# Patient Record
Sex: Male | Born: 1970 | Race: White | Hispanic: No | Marital: Married | Smoking: Former smoker
Health system: Southern US, Community
[De-identification: ages and names within clinical notes are randomized; demographics above are authoritative.]

## PROBLEM LIST (undated history)

## (undated) DIAGNOSIS — J302 Other seasonal allergic rhinitis: Secondary | ICD-10-CM

## (undated) DIAGNOSIS — Z9109 Other allergy status, other than to drugs and biological substances: Secondary | ICD-10-CM

## (undated) DIAGNOSIS — T7840XA Allergy, unspecified, initial encounter: Secondary | ICD-10-CM

## (undated) DIAGNOSIS — C801 Malignant (primary) neoplasm, unspecified: Secondary | ICD-10-CM

## (undated) HISTORY — DX: Allergy, unspecified, initial encounter: T78.40XA

## (undated) HISTORY — DX: Malignant (primary) neoplasm, unspecified: C80.1

## (undated) HISTORY — DX: Other allergy status, other than to drugs and biological substances: Z91.09

## (undated) HISTORY — PX: WISDOM TOOTH EXTRACTION: SHX21

## (undated) HISTORY — PX: BASAL CELL CARCINOMA EXCISION: SHX1214

## (undated) HISTORY — DX: Other seasonal allergic rhinitis: J30.2

## (undated) HISTORY — PX: DENTAL SURGERY: SHX609

## (undated) HISTORY — PX: KNEE ARTHROSCOPY: SHX127

---

## 1991-04-29 HISTORY — PX: APPENDECTOMY: SHX54

## 2015-11-27 ENCOUNTER — Telehealth: Payer: Self-pay | Admitting: *Deleted

## 2015-11-27 NOTE — Telephone Encounter (Signed)
Unable to reach patient at time of Pre-Visit Call.  Left message for patient to return call when available.    

## 2015-11-28 ENCOUNTER — Ambulatory Visit (INDEPENDENT_AMBULATORY_CARE_PROVIDER_SITE_OTHER): Payer: Managed Care, Other (non HMO) | Admitting: Physician Assistant

## 2015-11-28 ENCOUNTER — Encounter: Payer: Self-pay | Admitting: Physician Assistant

## 2015-11-28 VITALS — BP 128/88 | Temp 98.3°F | Resp 16 | Ht 70.0 in | Wt 299.5 lb

## 2015-11-28 DIAGNOSIS — Z23 Encounter for immunization: Secondary | ICD-10-CM

## 2015-11-28 DIAGNOSIS — E668 Other obesity: Secondary | ICD-10-CM

## 2015-11-28 DIAGNOSIS — R7989 Other specified abnormal findings of blood chemistry: Secondary | ICD-10-CM | POA: Diagnosis not present

## 2015-11-28 DIAGNOSIS — Z0000 Encounter for general adult medical examination without abnormal findings: Secondary | ICD-10-CM | POA: Diagnosis not present

## 2015-11-28 DIAGNOSIS — IMO0002 Reserved for concepts with insufficient information to code with codable children: Secondary | ICD-10-CM

## 2015-11-28 LAB — COMPREHENSIVE METABOLIC PANEL
ALT: 30 U/L (ref 0–53)
AST: 19 U/L (ref 0–37)
Albumin: 4.3 g/dL (ref 3.5–5.2)
Alkaline Phosphatase: 69 U/L (ref 39–117)
BUN: 12 mg/dL (ref 6–23)
CALCIUM: 9.3 mg/dL (ref 8.4–10.5)
CHLORIDE: 103 meq/L (ref 96–112)
CO2: 25 meq/L (ref 19–32)
Creatinine, Ser: 0.94 mg/dL (ref 0.40–1.50)
GFR: 92.28 mL/min (ref >60.00)
Glucose, Bld: 96 mg/dL (ref 70–99)
POTASSIUM: 3.9 meq/L (ref 3.5–5.1)
Sodium: 137 mEq/L (ref 135–145)
Total Bilirubin: 0.6 mg/dL (ref 0.2–1.2)
Total Protein: 7.5 g/dL (ref 6.0–8.3)

## 2015-11-28 LAB — URINALYSIS, ROUTINE W REFLEX MICROSCOPIC
Bilirubin Urine: NEGATIVE
KETONES UR: NEGATIVE
Leukocytes, UA: NEGATIVE
Nitrite: NEGATIVE
SPECIFIC GRAVITY, URINE: 1.02 (ref 1.000–1.030)
Total Protein, Urine: NEGATIVE
UROBILINOGEN UA: 0.2 (ref 0.0–1.0)
Urine Glucose: NEGATIVE
WBC UA: NONE SEEN (ref 0–?)
pH: 6 (ref 5.0–8.0)

## 2015-11-28 LAB — CBC
HCT: 43.4 % (ref 39.0–52.0)
HEMOGLOBIN: 15.1 g/dL (ref 13.0–17.0)
MCHC: 34.7 g/dL (ref 30.0–36.0)
MCV: 90 fl (ref 78.0–100.0)
PLATELETS: 236 10*3/uL (ref 150.0–400.0)
RBC: 4.82 Mil/uL (ref 4.22–5.81)
RDW: 13.1 % (ref 11.5–15.5)
WBC: 6.4 10*3/uL (ref 4.0–10.5)

## 2015-11-28 LAB — LIPID PANEL
CHOL/HDL RATIO: 5
Cholesterol: 187 mg/dL (ref 0–200)
HDL: 34.3 mg/dL — AB (ref >39.00)
NONHDL: 153.18
TRIGLYCERIDES: 219 mg/dL — AB (ref 0.0–149.0)
VLDL: 43.8 mg/dL — AB (ref 0.0–40.0)

## 2015-11-28 LAB — LDL CHOLESTEROL, DIRECT: LDL DIRECT: 125 mg/dL

## 2015-11-28 LAB — HEMOGLOBIN A1C: Hgb A1c MFr Bld: 5.6 % (ref 4.6–6.5)

## 2015-11-28 LAB — TSH: TSH: 1.24 u[IU]/mL (ref 0.35–4.50)

## 2015-11-28 NOTE — Progress Notes (Signed)
Patient presents to clinic today to establish care. Patient has not seen a healthcare provider in some time. Was encouraged by wife who is a patient here, to establish care. Body mass index is 42.97 kg/m. Endorses well-balance diet. 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 Notes only exercise is chasing after his children, ages 45 and 45.  Acute Concerns: Denies acute concerns today.  Chronic Issues: Denies known chronic medial issues.  Health Maintenance: Immunizations -- Due for TDaP. Agrees to have today.  Past Medical History:  Diagnosis Date  . Environmental allergies   . Seasonal allergies     Past Surgical History:  Procedure Laterality Date  . APPENDECTOMY  1993  . DENTAL SURGERY    . WISDOM TOOTH EXTRACTION     Approx 15 yrs ago    No current outpatient prescriptions on file prior to visit.   No current facility-administered medications on file prior to visit.     Allergies  Allergen Reactions  . Amoxicillin Other (See Comments)    Nosebleeds    Family History  Problem Relation Age of Onset  . Ovarian cancer Mother 86    Deceased  . Diabetes Mother   . Hypertension Mother   . Melanoma Father     Living  . Hypertension Father   . Diabetes Father   . Arthritis/Rheumatoid Sister     #1  . Healthy Sister     #2  . Healthy Brother     x1  . Diabetes Paternal Grandmother   . Emphysema Paternal Grandfather   . Healthy Daughter     Age 47  . Healthy Son     Age 52    Social History   Social History  . Marital status: Married    Spouse name: N/A  . Number of children: N/A  . Years of education: N/A   Occupational History  . Not on file.   Social History Main Topics  . Smoking status: Light Tobacco Smoker    Types: Cigars  . Smokeless tobacco: Never Used     Comment: Cigar Only, not regularly  . Alcohol use 2.4 oz/week    4 Cans of beer per week  . Drug use: No  . Sexual activity: Yes    Partners: Female     Comment: wife   Other  Topics Concern  . Not on file   Social History Narrative   Born in [place]. Moved to the area 21 years.    Is married -- Sammuel Hines And Claris Gower -- 11 and 9 respectively.          Review of Systems  Constitutional: Negative for fever and weight loss.  HENT: Negative for ear discharge, ear pain, hearing loss and tinnitus.   Eyes: Negative for blurred vision, double vision, photophobia and pain.  Respiratory: Negative for cough and shortness of breath.   Cardiovascular: Negative for chest pain and palpitations.  Gastrointestinal: Negative for abdominal pain, blood in stool, constipation, diarrhea, heartburn, melena, nausea and vomiting.  Genitourinary: Negative for dysuria, flank pain, frequency, hematuria and urgency.  Musculoskeletal: Negative for falls.  Neurological: Negative for dizziness, loss of consciousness and headaches.  Endo/Heme/Allergies: Negative for environmental allergies.  Psychiatric/Behavioral: Negative for depression, hallucinations, substance abuse and suicidal ideas. The patient is not nervous/anxious and does not have insomnia.     BP 128/88 (BP Location: Right Arm, Patient Position: Sitting, Cuff Size: Large)   Temp 98.3 F (36.8 C) (Oral)  Resp 16   Ht  (1.778 m)   Wt 299 lb 8 oz (135.9 kg)   SpO2 98%   BMI 42.97 kg/m   Physical Exam  Constitutional: He is oriented to person, place, and time and well-developed, well-nourished, and in no distress.  HENT:  Head: Normocephalic and atraumatic.  Right Ear: External ear normal.  Left Ear: External ear normal.  Nose: Nose normal.  Mouth/Throat: Oropharynx is clear and moist. No oropharyngeal exudate.  Eyes: Conjunctivae and EOM are normal. Pupils are equal, round, and reactive to light.  Neck: Neck supple. No thyromegaly present.  Cardiovascular: Normal rate, regular rhythm, normal heart sounds and intact distal pulses.   Pulmonary/Chest: Effort normal and breath sounds normal. No  respiratory distress. He has no wheezes. He has no rales. He exhibits no tenderness.  Abdominal: Soft. Bowel sounds are normal. He exhibits no distension and no mass. There is no tenderness. There is no rebound and no guarding.  Genitourinary: Testes/scrotum normal.  Lymphadenopathy:    He has no cervical adenopathy.  Neurological: He is alert and oriented to person, place, and time.  Skin: Skin is warm and dry. No rash noted.  Psychiatric: Affect normal.  Vitals reviewed.  Assessment/Plan: Adult BMI > 30 Body mass index is 42.97 kg/m. Discussed exercise goal of 150 minutes per week. Calorie counting reviewed. Will check TSH today. Will follow.  Visit for preventive health examination Depression screen negative. Health Maintenance reviewed -- Declines HIV screen. TDaP updated today. Preventive schedule discussed and handout given in AVS. Will obtain fasting labs today.     Piedad Climes, PA-C

## 2015-11-28 NOTE — Progress Notes (Signed)
Pre visit review using our clinic review tool, if applicable. No additional management support is needed unless otherwise documented below in the visit note/SLS  

## 2015-11-28 NOTE — Patient Instructions (Signed)
Please go to the lab for blood work.   Our office will call you with your results unless you have chosen to receive results via MyChart.  If your blood work is normal we will follow-up each year for physicals and as scheduled for chronic medical problems.  If anything is abnormal we will treat accordingly and get you in for a follow-up.  I recommend you work on exercise to promote healthy weight. Goal is for 150 minutes of exercise per week. I would start with 30 minutes per day for 2-3 days for a couple of weeks before increasing further.   Preventive Care for Adults, Male A healthy lifestyle and preventive care can promote health and wellness. Preventive health guidelines for men include the following key practices:  A routine yearly physical is a good way to check with your health care provider about your health and preventative screening. It is a chance to share any concerns and updates on your health and to receive a thorough exam.  Visit your dentist for a routine exam and preventative care every 6 months. Brush your teeth twice a day and floss once a day. Good oral hygiene prevents tooth decay and gum disease.  The frequency of eye exams is based on your age, health, family medical history, use of contact lenses, and other factors. Follow your health care provider's recommendations for frequency of eye exams.  Eat a healthy diet. Foods such as vegetables, fruits, whole grains, low-fat dairy products, and lean protein foods contain the nutrients you need without too many calories. Decrease your intake of foods high in solid fats, added sugars, and salt. Eat the right amount of calories for you.Get information about a proper diet from your health care provider, if necessary.  Regular physical exercise is one of the most important things you can do for your health. Most adults should get at least 150 minutes of moderate-intensity exercise (any activity that increases your heart rate and  causes you to sweat) each week. In addition, most adults need muscle-strengthening exercises on 2 or more days a week.  Maintain a healthy weight. The body mass index (BMI) is a screening tool to identify possible weight problems. It provides an estimate of body fat based on height and weight. Your health care provider can find your BMI and can help you achieve or maintain a healthy weight.For adults 20 years and older:  A BMI below 18.5 is considered underweight.  A BMI of 18.5 to 24.9 is normal.  A BMI of 25 to 29.9 is considered overweight.  A BMI of 30 and above is considered obese.  Maintain normal blood lipids and cholesterol levels by exercising and minimizing your intake of saturated fat. Eat a balanced diet with plenty of fruit and vegetables. Blood tests for lipids and cholesterol should begin at age 70 and be repeated every 5 years. If your lipid or cholesterol levels are high, you are over 50, or you are at high risk for heart disease, you may need your cholesterol levels checked more frequently.Ongoing high lipid and cholesterol levels should be treated with medicines if diet and exercise are not working.  If you smoke, find out from your health care provider how to quit. If you do not use tobacco, do not start.  Lung cancer screening is recommended for adults aged 49-80 years who are at high risk for developing lung cancer because of a history of smoking. A yearly low-dose CT scan of the lungs is recommended for people  who have at least a 30-pack-year history of smoking and are a current smoker or have quit within the past 15 years. A pack year of smoking is smoking an average of 1 pack of cigarettes a day for 1 year (for example: 1 pack a day for 30 years or 2 packs a day for 15 years). Yearly screening should continue until the smoker has stopped smoking for at least 15 years. Yearly screening should be stopped for people who develop a health problem that would prevent them from  having lung cancer treatment.  If you choose to drink alcohol, do not have more than 2 drinks per day. One drink is considered to be 12 ounces (355 mL) of beer, 5 ounces (148 mL) of wine, or 1.5 ounces (44 mL) of liquor.  Avoid use of street drugs. Do not share needles with anyone. Ask for help if you need support or instructions about stopping the use of drugs.  High blood pressure causes heart disease and increases the risk of stroke. Your blood pressure should be checked at least every 1-2 years. Ongoing high blood pressure should be treated with medicines, if weight loss and exercise are not effective.  If you are 56-55 years old, ask your health care provider if you should take aspirin to prevent heart disease.  Diabetes screening is done by taking a blood sample to check your blood glucose level after you have not eaten for a certain period of time (fasting). If you are not overweight and you do not have risk factors for diabetes, you should be screened once every 3 years starting at age 71. If you are overweight or obese and you are 34-57 years of age, you should be screened for diabetes every year as part of your cardiovascular risk assessment.  Colorectal cancer can be detected and often prevented. Most routine colorectal cancer screening begins at the age of 67 and continues through age 77. However, your health care provider may recommend screening at an earlier age if you have risk factors for colon cancer. On a yearly basis, your health care provider may provide home test kits to check for hidden blood in the stool. Use of a small camera at the end of a tube to directly examine the colon (sigmoidoscopy or colonoscopy) can detect the earliest forms of colorectal cancer. Talk to your health care provider about this at age 15, when routine screening begins. Direct exam of the colon should be repeated every 5-10 years through age 56, unless early forms of precancerous polyps or small growths are  found.  People who are at an increased risk for hepatitis B should be screened for this virus. You are considered at high risk for hepatitis B if:  You were born in a country where hepatitis B occurs often. Talk with your health care provider about which countries are considered high risk.  Your parents were born in a high-risk country and you have not received a shot to protect against hepatitis B (hepatitis B vaccine).  You have HIV or AIDS.  You use needles to inject street drugs.  You live with, or have sex with, someone who has hepatitis B.  You are a man who has sex with other men (MSM).  You get hemodialysis treatment.  You take certain medicines for conditions such as cancer, organ transplantation, and autoimmune conditions.  Hepatitis C blood testing is recommended for all people born from [birth year] through 1965 and any individual with known risks for hepatitis C.  Practice safe sex. Use condoms and avoid high-risk sexual practices to reduce the spread of sexually transmitted infections (STIs). STIs include gonorrhea, chlamydia, syphilis, trichomonas, herpes, HPV, and human immunodeficiency virus (HIV). Herpes, HIV, and HPV are viral illnesses that have no cure. They can result in disability, cancer, and death.  If you are a man who has sex with other men, you should be screened at least once per year for:  HIV.  Urethral, rectal, and pharyngeal infection of gonorrhea, chlamydia, or both.  If you are at risk of being infected with HIV, it is recommended that you take a prescription medicine daily to prevent HIV infection. This is called preexposure prophylaxis (PrEP). You are considered at risk if:  You are a man who has sex with other men (MSM) and have other risk factors.  You are a heterosexual man, are sexually active, and are at increased risk for HIV infection.  You take drugs by injection.  You are sexually active with a partner who has HIV.  Talk with your health  care provider about whether you are at high risk of being infected with HIV. If you choose to begin PrEP, you should first be tested for HIV. You should then be tested every 3 months for as long as you are taking PrEP.  A one-time screening for abdominal aortic aneurysm (AAA) and surgical repair of large AAAs by ultrasound are recommended for men ages 51 to 14 years who are current or former smokers.  Healthy men should no longer receive prostate-specific antigen (PSA) blood tests as part of routine cancer screening. Talk with your health care provider about prostate cancer screening.  Testicular cancer screening is not recommended for adult males who have no symptoms. Screening includes self-exam, a health care provider exam, and other screening tests. Consult with your health care provider about any symptoms you have or any concerns you have about testicular cancer.  Use sunscreen. Apply sunscreen liberally and repeatedly throughout the day. You should seek shade when your shadow is shorter than you. Protect yourself by wearing long sleeves, pants, a wide-brimmed hat, and sunglasses year round, whenever you are outdoors.  Once a month, do a whole-body skin exam, using a mirror to look at the skin on your back. Tell your health care provider about new moles, moles that have irregular borders, moles that are larger than a pencil eraser, or moles that have changed in shape or color.  Stay current with required vaccines (immunizations).  Influenza vaccine. All adults should be immunized every year.  Tetanus, diphtheria, and acellular pertussis (Td, Tdap) vaccine. An adult who has not previously received Tdap or who does not know his vaccine status should receive 1 dose of Tdap. This initial dose should be followed by tetanus and diphtheria toxoids (Td) booster doses every 10 years. Adults with an unknown or incomplete history of completing a 3-dose immunization series with Td-containing vaccines should  begin or complete a primary immunization series including a Tdap dose. Adults should receive a Td booster every 10 years.  Varicella vaccine. An adult without evidence of immunity to varicella should receive 2 doses or a second dose if he has previously received 1 dose.  Human papillomavirus (HPV) vaccine. Males aged 11-21 years who have not received the vaccine previously should receive the 3-dose series. Males aged 22-26 years may be immunized. Immunization is recommended through the age of 39 years for any male who has sex with males and did not get any or all doses  earlier. Immunization is recommended for any person with an immunocompromised condition through the age of 39 years if he did not get any or all doses earlier. During the 3-dose series, the second dose should be obtained 4-8 weeks after the first dose. The third dose should be obtained 24 weeks after the first dose and 16 weeks after the second dose.  Zoster vaccine. One dose is recommended for adults aged 44 years or older unless certain conditions are present.  Measles, mumps, and rubella (MMR) vaccine. Adults born before [birth year] generally are considered immune to measles and mumps. Adults born in [birth year] or later should have 1 or more doses of MMR vaccine unless there is a contraindication to the vaccine or there is laboratory evidence of immunity to each of the three diseases. A routine second dose of MMR vaccine should be obtained at least 28 days after the first dose for students attending postsecondary schools, health care workers, or international travelers. People who received inactivated measles vaccine or an unknown type of measles vaccine during 1963-1967 should receive 2 doses of MMR vaccine. People who received inactivated mumps vaccine or an unknown type of mumps vaccine before 1979 and are at high risk for mumps infection should consider immunization with 2 doses of MMR vaccine. Unvaccinated health care workers born before [birth year] who  lack laboratory evidence of measles, mumps, or rubella immunity or laboratory confirmation of disease should consider measles and mumps immunization with 2 doses of MMR vaccine or rubella immunization with 1 dose of MMR vaccine.  Pneumococcal 13-valent conjugate (PCV13) vaccine. When indicated, a person who is uncertain of his immunization history and has no record of immunization should receive the PCV13 vaccine. All adults 54 years of age and older should receive this vaccine. An adult aged 42 years or older who has certain medical conditions and has not been previously immunized should receive 1 dose of PCV13 vaccine. This PCV13 should be followed with a dose of pneumococcal polysaccharide (PPSV23) vaccine. Adults who are at high risk for pneumococcal disease should obtain the PPSV23 vaccine at least 8 weeks after the dose of PCV13 vaccine. Adults older than 45 years of age who have normal immune system function should obtain the PPSV23 vaccine dose at least 1 year after the dose of PCV13 vaccine.  Pneumococcal polysaccharide (PPSV23) vaccine. When PCV13 is also indicated, PCV13 should be obtained first. All adults aged 10 years and older should be immunized. An adult younger than age 81 years who has certain medical conditions should be immunized. Any person who resides in a nursing home or long-term care facility should be immunized. An adult smoker should be immunized. People with an immunocompromised condition and certain other conditions should receive both PCV13 and PPSV23 vaccines. People with human immunodeficiency virus (HIV) infection should be immunized as soon as possible after diagnosis. Immunization during chemotherapy or radiation therapy should be avoided. Routine use of PPSV23 vaccine is not recommended for American Indians, [employer] Natives, or people younger than 65 years unless there are medical conditions that require PPSV23 vaccine. When indicated, people who have unknown immunization and  have no record of immunization should receive PPSV23 vaccine. One-time revaccination 5 years after the first dose of PPSV23 is recommended for people aged 19-64 years who have chronic kidney failure, nephrotic syndrome, asplenia, or immunocompromised conditions. People who received 1-2 doses of PPSV23 before age 71 years should receive another dose of PPSV23 vaccine at age 37 years or later if at least 5 years have  passed since the previous dose. Doses of PPSV23 are not needed for people immunized with PPSV23 at or after age 56 years.  Meningococcal vaccine. Adults with asplenia or persistent complement component deficiencies should receive 2 doses of quadrivalent meningococcal conjugate (MenACWY-D) vaccine. The doses should be obtained at least 2 months apart. Microbiologists working with certain meningococcal bacteria, [employer] recruits, people at risk during an outbreak, and people who travel to or live in countries with a high rate of meningitis should be immunized. A first-year college student up through age 49 years who is living in a residence hall should receive a dose if he did not receive a dose on or after his 16th birthday. Adults who have certain high-risk conditions should receive one or more doses of vaccine.  Hepatitis A vaccine. Adults who wish to be protected from this disease, have chronic liver disease, work with hepatitis A-infected animals, work in hepatitis A research labs, or travel to or work in countries with a high rate of hepatitis A should be immunized. Adults who were previously unvaccinated and who anticipate close contact with an international adoptee during the first 60 days after arrival in the [place] from a country with a high rate of hepatitis A should be immunized.  Hepatitis B vaccine. Adults should be immunized if they wish to be protected from this disease, are under age 3 years and have diabetes, have chronic liver disease, have had more than one sex partner in  the past 6 months, may be exposed to blood or other infectious body fluids, are household contacts or sex partners of hepatitis B positive people, are clients or workers in certain care facilities, or travel to or work in countries with a high rate of hepatitis B.  Haemophilus influenzae type b (Hib) vaccine. A previously unvaccinated person with asplenia or sickle cell disease or having a scheduled splenectomy should receive 1 dose of Hib vaccine. Regardless of previous immunization, a recipient of a hematopoietic stem cell transplant should receive a 3-dose series 6-12 months after his successful transplant. Hib vaccine is not recommended for adults with HIV infection. Preventive Service / Frequency Ages 62 to 45  Blood pressure check.** / Every 3-5 years.  Lipid and cholesterol check.** / Every 5 years beginning at age 89.  Hepatitis C blood test.** / For any individual with known risks for hepatitis C.  Skin self-exam. / Monthly.  Influenza vaccine. / Every year.  Tetanus, diphtheria, and acellular pertussis (Tdap, Td) vaccine.** / Consult your health care provider. 1 dose of Td every 10 years.  Varicella vaccine.** / Consult your health care provider.  HPV vaccine. / 3 doses over 6 months, if 67 or younger.  Measles, mumps, rubella (MMR) vaccine.** / You need at least 1 dose of MMR if you were born in 1957 or later. You may also need a second dose.  Pneumococcal 13-valent conjugate (PCV13) vaccine.** / Consult your health care provider.  Pneumococcal polysaccharide (PPSV23) vaccine.** / 1 to 2 doses if you smoke cigarettes or if you have certain conditions.  Meningococcal vaccine.** / 1 dose if you are age 38 to 67 years and a [employer] researcher living in a residence hall, or have one of several medical conditions. You may also need additional booster doses.  Hepatitis A vaccine.** / Consult your health care provider.  Hepatitis B vaccine.** / Consult your health care  provider.  Haemophilus influenzae type b (Hib) vaccine.** / Consult your health care provider. Ages 58 to 57  Blood pressure check.** / Every year.  Lipid and cholesterol check.** / Every 5 years beginning at age 42.  Lung cancer screening. / Every year if you are aged 48-80 years and have a 30-pack-year history of smoking and currently smoke or have quit within the past 15 years. Yearly screening is stopped once you have quit smoking for at least 15 years or develop a health problem that would prevent you from having lung cancer treatment.  Fecal occult blood test (FOBT) of stool. / Every year beginning at age 20 and continuing until age 96. You may not have to do this test if you get a colonoscopy every 10 years.  Flexible sigmoidoscopy** or colonoscopy.** / Every 5 years for a flexible sigmoidoscopy or every 10 years for a colonoscopy beginning at age 57 and continuing until age 18.  Hepatitis C blood test.** / For all people born from [birth year] through 1965 and any individual with known risks for hepatitis C.  Skin self-exam. / Monthly.  Influenza vaccine. / Every year.  Tetanus, diphtheria, and acellular pertussis (Tdap/Td) vaccine.** / Consult your health care provider. 1 dose of Td every 10 years.  Varicella vaccine.** / Consult your health care provider.  Zoster vaccine.** / 1 dose for adults aged 31 years or older.  Measles, mumps, rubella (MMR) vaccine.** / You need at least 1 dose of MMR if you were born in 1957 or later. You may also need a second dose.  Pneumococcal 13-valent conjugate (PCV13) vaccine.** / Consult your health care provider.  Pneumococcal polysaccharide (PPSV23) vaccine.** / 1 to 2 doses if you smoke cigarettes or if you have certain conditions.  Meningococcal vaccine.** / Consult your health care provider.  Hepatitis A vaccine.** / Consult your health care provider.  Hepatitis B vaccine.** / Consult your health care provider.  Haemophilus influenzae  type b (Hib) vaccine.** / Consult your health care provider. Ages 28 and over  Blood pressure check.** / Every year.  Lipid and cholesterol check.**/ Every 5 years beginning at age 64.  Lung cancer screening. / Every year if you are aged 74-80 years and have a 30-pack-year history of smoking and currently smoke or have quit within the past 15 years. Yearly screening is stopped once you have quit smoking for at least 15 years or develop a health problem that would prevent you from having lung cancer treatment.  Fecal occult blood test (FOBT) of stool. / Every year beginning at age 51 and continuing until age 48. You may not have to do this test if you get a colonoscopy every 10 years.  Flexible sigmoidoscopy** or colonoscopy.** / Every 5 years for a flexible sigmoidoscopy or every 10 years for a colonoscopy beginning at age 55 and continuing until age 42.  Hepatitis C blood test.** / For all people born from [birth year] through 1965 and any individual with known risks for hepatitis C.  Abdominal aortic aneurysm (AAA) screening.** / A one-time screening for ages 61 to 69 years who are current or former smokers.  Skin self-exam. / Monthly.  Influenza vaccine. / Every year.  Tetanus, diphtheria, and acellular pertussis (Tdap/Td) vaccine.** / 1 dose of Td every 10 years.  Varicella vaccine.** / Consult your health care provider.  Zoster vaccine.** / 1 dose for adults aged 72 years or older.  Pneumococcal 13-valent conjugate (PCV13) vaccine.** / 1 dose for all adults aged 7 years and older.  Pneumococcal polysaccharide (PPSV23) vaccine.** / 1 dose for all adults aged 28 years and older.  Meningococcal vaccine.** / Consult your health care provider.  Hepatitis A vaccine.** / Consult your health care provider.  Hepatitis B vaccine.** / Consult your health care provider.  Haemophilus influenzae type b (Hib) vaccine.** / Consult your health care provider. **Family history and personal history  of risk and conditions may change your health care provider's recommendations.   This information is not intended to replace advice given to you by your health care provider. Make sure you discuss any questions you have with your health care provider.   Document Released: 06/10/2001 Document Revised: 05/05/2014 Document Reviewed: 09/09/2010 Elsevier Interactive Patient Education [employer].

## 2015-11-28 NOTE — Assessment & Plan Note (Signed)
Body mass index is 42.97 kg/m. Discussed exercise goal of 150 minutes per week. Calorie counting reviewed. Will check TSH today. Will follow.

## 2015-11-28 NOTE — Assessment & Plan Note (Signed)
Depression screen negative. Health Maintenance reviewed -- Declines HIV screen. TDaP updated today. Preventive schedule discussed and handout given in AVS. Will obtain fasting labs today.

## 2015-12-21 ENCOUNTER — Telehealth: Payer: Self-pay | Admitting: Physician Assistant

## 2015-12-21 NOTE — Telephone Encounter (Signed)
Pt's wife dropped off document to be filled out by PCP [facility](Health Provider Screening Form)-2 pages. Document put at front office tray.

## 2016-08-05 ENCOUNTER — Other Ambulatory Visit: Payer: Self-pay | Admitting: Emergency Medicine

## 2016-08-06 ENCOUNTER — Other Ambulatory Visit: Payer: Self-pay | Admitting: Emergency Medicine

## 2016-08-06 DIAGNOSIS — R52 Pain, unspecified: Secondary | ICD-10-CM

## 2016-08-08 ENCOUNTER — Ambulatory Visit
Admission: RE | Admit: 2016-08-08 | Discharge: 2016-08-08 | Disposition: A | Discharge status: Home / Self Care | Payer: Commercial Managed Care - PPO | Source: Ambulatory Visit | Attending: Emergency Medicine | Admitting: Emergency Medicine

## 2016-08-08 DIAGNOSIS — R52 Pain, unspecified: Secondary | ICD-10-CM

## 2016-12-24 NOTE — Progress Notes (Addendum)
[facility] at [facility] [address], [address] [place], [place] [address] [phone]  Date:  12/25/2016   Name:  Sergio Cardenas   DOB:  1971-03-17   MRN:  [id number]  PCP:  Waldon Merl, PA-C    Chief Complaint: Annual Exam   History of Present Illness:  Sergio Cardenas is a 46 y.o. very pleasant male patient who presents with the following:  Former pt of Malva Cogan who has moved to a new location Generally in good health except for overweight Last labs about a year ago Tetanus booster: he has no idea, he thinks 10 years ago likely Flu shot: will do today  He did have left knee surgery 2 months ago- a scope, for a torn meniscus. Done per Dr. Juliene Pina at [facility] The knee seems to be doing well, except it may hurt some when going down steps It held up to a trip to [facility] last weekend.   He also had a skin operation at [place] [facility] to remove a skin cyst- no skin cancer noted   He is fasting today for labs No genital concerns   BP Readings from Last 3 Encounters:  12/25/16 121/86  11/28/15 128/88   Wt Readings from Last 3 Encounters:  12/25/16 (!) 310 lb 6.4 oz (140.8 kg)  11/28/15 299 lb 8 oz (135.9 kg)     Patient Active Problem List   Diagnosis Date Noted  . Visit for preventive health examination 11/28/2015  . Adult BMI > 30 11/28/2015    Past Medical History:  Diagnosis Date  . Environmental allergies   . Seasonal allergies     Past Surgical History:  Procedure Laterality Date  . APPENDECTOMY  1993  . DENTAL SURGERY    . WISDOM TOOTH EXTRACTION     Approx 15 yrs ago    Social History  Substance Use Topics  . Smoking status: Light Tobacco Smoker    Types: Cigars  . Smokeless tobacco: Never Used     Comment: Cigar Only, not regularly  . Alcohol use 2.4 oz/week    4 Cans of beer per week    Family History  Problem Relation Age of Onset  . Ovarian cancer Mother 29       Deceased  . Diabetes Mother   .  Hypertension Mother   . Melanoma Father        Living  . Hypertension Father   . Diabetes Father   . Arthritis/Rheumatoid Sister        #1  . Healthy Sister        #2  . Healthy Brother        x1  . Diabetes Paternal Grandmother   . Emphysema Paternal Grandfather   . Healthy Daughter        Age 41  . Healthy Son        Age 72    Allergies  Allergen Reactions  . Amoxicillin Other (See Comments)    Nosebleeds    Medication list has been reviewed and updated.  Current Outpatient Prescriptions on File Prior to Visit  Medication Sig Dispense Refill  . Ascorbic Acid (VITAMIN C) 100 MG tablet Take 100 mg by mouth daily.    . Aspirin-Acetaminophen-Caffeine (EXCEDRIN PO) Take by mouth daily as needed.    . cetirizine (ZYRTEC) 10 MG tablet Take 10 mg by mouth daily as needed for allergies.    . Multiple Vitamins-Minerals (MENS MULTIVITAMIN PLUS) TABS  Take by mouth daily.     No current facility-administered medications on file prior to visit.     Review of Systems:  As per HPI- otherwise negative. No fever or chills No CP or SOB No nausea, vomiting or diarrhea    Physical Examination: Vitals:   12/25/16 0931  BP: 121/86  Pulse: 74  Temp: 98.1 F (36.7 C)  SpO2: 98%   Vitals:   12/25/16 0931  Weight: (!) 310 lb 6.4 oz (140.8 kg)  Height: 5\' 10"  (1.778 m)   Body mass index is 44.54 kg/m. Ideal Body Weight: Weight in (lb) to have BMI = 25: 173.9  GEN: WDWN, NAD, Non-toxic, A & O x 3, obese, otherwise looks well HEENT: Atraumatic, Normocephalic. Neck supple. No masses, No LAD.  Bilateral TM wnl, oropharynx normal.  PEERL,EOMI.   Ears and Nose: No external deformity. CV: RRR, No M/G/R. No JVD. No thrill. No extra heart sounds. PULM: CTA B, no wheezes, crackles, rhonchi. No retractions. No resp. distress. No accessory muscle use. ABD: S, NT, ND. No rebound. No HSM.  Benign belly EXTR: No c/c/e NEURO Normal gait.  PSYCH: Normally interactive. Conversant. Not  depressed or anxious appearing.  Calm demeanor.    Assessment and Plan: Physical exam  Screening for deficiency anemia - Plan: CBC  Screening for diabetes mellitus - Plan: Comprehensive metabolic panel, Hemoglobin A1c  Screening for hyperlipidemia - Plan: Lipid panel  Immunization due - Plan: Tdap vaccine greater than or equal to 7yo IM  Class 3 obesity without serious comorbidity with body mass index (BMI) of 40.0 to 44.9 in adult, unspecified obesity type (HCC)  CPE today Doing well except for obesity - encouraged gradual weight loss though diet and exercise Will complete and fax in wellness form when his labs return Will plan further follow- up pending labs.  Signed Abbe Amsterdam, MD  Results for orders placed or performed in visit on 12/25/16  CBC  Result Value Ref Range   WBC 7.4 4.0 - 10.5 K/uL   RBC 4.90 4.22 - 5.81 Mil/uL   Platelets 257.0 150.0 - 400.0 K/uL   Hemoglobin 15.4 13.0 - 17.0 g/dL   HCT 16.1 09.6 - 04.5 %   MCV 94.1 78.0 - 100.0 fl   MCHC 33.5 30.0 - 36.0 g/dL   RDW 40.9 81.1 - 91.4 %  Comprehensive metabolic panel  Result Value Ref Range   Sodium 138 135 - 145 mEq/L   Potassium 4.2 3.5 - 5.1 mEq/L   Chloride 102 96 - 112 mEq/L   CO2 31 19 - 32 mEq/L   Glucose, Bld 98 70 - 99 mg/dL   BUN 14 6 - 23 mg/dL   Creatinine, Ser 7.82 0.40 - 1.50 mg/dL   Total Bilirubin 0.6 0.2 - 1.2 mg/dL   Alkaline Phosphatase 70 39 - 117 U/L   AST 18 0 - 37 U/L   ALT 31 0 - 53 U/L   Total Protein 7.3 6.0 - 8.3 g/dL   Albumin 4.3 3.5 - 5.2 g/dL   Calcium 9.3 8.4 - 95.6 mg/dL   GFR 21.30 >86.57 mL/min  Lipid panel  Result Value Ref Range   Cholesterol 198 0 - 200 mg/dL   Triglycerides 846.9 (H) 0.0 - 149.0 mg/dL   HDL 62.95 (L) >28.41 mg/dL   VLDL 32.4 0.0 - 40.1 mg/dL   LDL Cholesterol 027 (H) 0 - 99 mg/dL   Total CHOL/HDL Ratio 6    NonHDL 164.13   Hemoglobin A1c  Result Value Ref Range   Hgb A1c MFr Bld 5.9 4.6 - 6.5 %   Received labs- message to pt on  mychart 8/31

## 2016-12-25 ENCOUNTER — Encounter: Payer: Self-pay | Admitting: Family Medicine

## 2016-12-25 ENCOUNTER — Ambulatory Visit (INDEPENDENT_AMBULATORY_CARE_PROVIDER_SITE_OTHER): Payer: Commercial Managed Care - PPO | Admitting: Family Medicine

## 2016-12-25 VITALS — BP 121/86 | HR 74 | Temp 98.1°F | Ht 70.0 in | Wt 310.4 lb

## 2016-12-25 DIAGNOSIS — Z130 Encounter for screening for diseases of the blood and blood-forming organs and certain disorders involving the immune mechanism: Secondary | ICD-10-CM | POA: Diagnosis not present

## 2016-12-25 DIAGNOSIS — Z23 Encounter for immunization: Secondary | ICD-10-CM | POA: Diagnosis not present

## 2016-12-25 DIAGNOSIS — Z131 Encounter for screening for diabetes mellitus: Secondary | ICD-10-CM

## 2016-12-25 DIAGNOSIS — E669 Obesity, unspecified: Secondary | ICD-10-CM | POA: Diagnosis not present

## 2016-12-25 DIAGNOSIS — Z13220 Encounter for screening for lipoid disorders: Secondary | ICD-10-CM | POA: Diagnosis not present

## 2016-12-25 DIAGNOSIS — IMO0001 Reserved for inherently not codable concepts without codable children: Secondary | ICD-10-CM

## 2016-12-25 DIAGNOSIS — Z0000 Encounter for general adult medical examination without abnormal findings: Secondary | ICD-10-CM | POA: Diagnosis not present

## 2016-12-25 DIAGNOSIS — Z6841 Body Mass Index (BMI) 40.0 and over, adult: Secondary | ICD-10-CM | POA: Diagnosis not present

## 2016-12-25 LAB — COMPREHENSIVE METABOLIC PANEL
ALT: 31 U/L (ref 0–53)
AST: 18 U/L (ref 0–37)
Albumin: 4.3 g/dL (ref 3.5–5.2)
Alkaline Phosphatase: 70 U/L (ref 39–117)
BILIRUBIN TOTAL: 0.6 mg/dL (ref 0.2–1.2)
BUN: 14 mg/dL (ref 6–23)
CALCIUM: 9.3 mg/dL (ref 8.4–10.5)
CHLORIDE: 102 meq/L (ref 96–112)
CO2: 31 meq/L (ref 19–32)
CREATININE: 1.03 mg/dL (ref 0.40–1.50)
GFR: 82.64 mL/min (ref >60.00)
GLUCOSE: 98 mg/dL (ref 70–99)
Potassium: 4.2 mEq/L (ref 3.5–5.1)
Sodium: 138 mEq/L (ref 135–145)
Total Protein: 7.3 g/dL (ref 6.0–8.3)

## 2016-12-25 LAB — CBC
HCT: 46.1 % (ref 39.0–52.0)
Hemoglobin: 15.4 g/dL (ref 13.0–17.0)
MCHC: 33.5 g/dL (ref 30.0–36.0)
MCV: 94.1 fl (ref 78.0–100.0)
PLATELETS: 257 10*3/uL (ref 150.0–400.0)
RBC: 4.9 Mil/uL (ref 4.22–5.81)
RDW: 13.1 % (ref 11.5–15.5)
WBC: 7.4 10*3/uL (ref 4.0–10.5)

## 2016-12-25 LAB — LIPID PANEL
CHOL/HDL RATIO: 6
Cholesterol: 198 mg/dL (ref 0–200)
HDL: 34 mg/dL — ABNORMAL LOW (ref >39.00)
LDL CALC: 127 mg/dL — AB (ref 0–99)
NONHDL: 164.13
TRIGLYCERIDES: 187 mg/dL — AB (ref 0.0–149.0)
VLDL: 37.4 mg/dL (ref 0.0–40.0)

## 2016-12-25 LAB — HEMOGLOBIN A1C: Hgb A1c MFr Bld: 5.9 % (ref 4.6–6.5)

## 2016-12-25 NOTE — Patient Instructions (Addendum)
It was very nice to meet you today! You got your tetanus booster (tdap- also covers the whooping cough) and flu shot today I will be in touch with your labs asap, and will send in your form when complete  Do try to work on gradual weight loss; even modest weight loss can help to preserve your health and your joints for years to come.    Exercise is important, but diet is probably even more crucial to weight control.   You might try a weight management app for your phone such as "my fitness pal" to help you keep track of your progress and make it a little more fun   Health Maintenance, Male A healthy lifestyle and preventive care is important for your health and wellness. Ask your health care provider about what schedule of regular examinations is right for you. What should I know about weight and diet? Eat a Healthy Diet  Eat plenty of vegetables, fruits, whole grains, low-fat dairy products, and lean protein.  Do not eat a lot of foods high in solid fats, added sugars, or salt.  Maintain a Healthy Weight Regular exercise can help you achieve or maintain a healthy weight. You should:  Do at least 150 minutes of exercise each week. The exercise should increase your heart rate and make you sweat (moderate-intensity exercise).  Do strength-training exercises at least twice a week.  Watch Your Levels of Cholesterol and Blood Lipids  Have your blood tested for lipids and cholesterol every 5 years starting at 46 years of age. If you are at high risk for heart disease, you should start having your blood tested when you are 46 years old. You may need to have your cholesterol levels checked more often if: ? Your lipid or cholesterol levels are high. ? You are older than 46 years of age. ? You are at high risk for heart disease.  What should I know about cancer screening? Many types of cancers can be detected early and may often be prevented. Lung Cancer  You should be screened every year  for lung cancer if: ? You are a current smoker who has smoked for at least 30 years. ? You are a former smoker who has quit within the past 15 years.  Talk to your health care provider about your screening options, when you should start screening, and how often you should be screened.  Colorectal Cancer  Routine colorectal cancer screening usually begins at 46 years of age and should be repeated every 5-10 years until you are 46 years old. You may need to be screened more often if early forms of precancerous polyps or small growths are found. Your health care provider may recommend screening at an earlier age if you have risk factors for colon cancer.  Your health care provider may recommend using home test kits to check for hidden blood in the stool.  A small camera at the end of a tube can be used to examine your colon (sigmoidoscopy or colonoscopy). This checks for the earliest forms of colorectal cancer.  Prostate and Testicular Cancer  Depending on your age and overall health, your health care provider may do certain tests to screen for prostate and testicular cancer.  Talk to your health care provider about any symptoms or concerns you have about testicular or prostate cancer.  Skin Cancer  Check your skin from head to toe regularly.  Tell your health care provider about any new moles or changes in moles, especially  if: ? There is a change in a mole's size, shape, or color. ? You have a mole that is larger than a pencil eraser.  Always use sunscreen. Apply sunscreen liberally and repeat throughout the day.  Protect yourself by wearing long sleeves, pants, a wide-brimmed hat, and sunglasses when outside.  What should I know about heart disease, diabetes, and high blood pressure?  If you are 85-37 years of age, have your blood pressure checked every 3-5 years. If you are 10 years of age or older, have your blood pressure checked every year. You should have your blood pressure  measured twice-once when you are at a hospital or clinic, and once when you are not at a hospital or clinic. Record the average of the two measurements. To check your blood pressure when you are not at a hospital or clinic, you can use: ? An automated blood pressure machine at a pharmacy. ? A home blood pressure monitor.  Talk to your health care provider about your target blood pressure.  If you are between 35-65 years old, ask your health care provider if you should take aspirin to prevent heart disease.  Have regular diabetes screenings by checking your fasting blood sugar level. ? If you are at a normal weight and have a low risk for diabetes, have this test once every three years after the age of 76. ? If you are overweight and have a high risk for diabetes, consider being tested at a younger age or more often.  A one-time screening for abdominal aortic aneurysm (AAA) by ultrasound is recommended for men aged 65-75 years who are current or former smokers. What should I know about preventing infection? Hepatitis B If you have a higher risk for hepatitis B, you should be screened for this virus. Talk with your health care provider to find out if you are at risk for hepatitis B infection. Hepatitis C Blood testing is recommended for:  Everyone born from [birth year] through 1965.  Anyone with known risk factors for hepatitis C.  Sexually Transmitted Diseases (STDs)  You should be screened each year for STDs including gonorrhea and chlamydia if: ? You are sexually active and are younger than 46 years of age. ? You are older than 46 years of age and your health care provider tells you that you are at risk for this type of infection. ? Your sexual activity has changed since you were last screened and you are at an increased risk for chlamydia or gonorrhea. Ask your health care provider if you are at risk.  Talk with your health care provider about whether you are at high risk of being infected  with HIV. Your health care provider may recommend a prescription medicine to help prevent HIV infection.  What else can I do?  Schedule regular health, dental, and eye exams.  Stay current with your vaccines (immunizations).  Do not use any tobacco products, such as cigarettes, chewing tobacco, and e-cigarettes. If you need help quitting, ask your health care provider.  Limit alcohol intake to no more than 2 drinks per day. One drink equals 12 ounces of beer, 5 ounces of wine, or 1 ounces of hard liquor.  Do not use street drugs.  Do not share needles.  Ask your health care provider for help if you need support or information about quitting drugs.  Tell your health care provider if you often feel depressed.  Tell your health care provider if you have ever been abused or do  not feel safe at home. This information is not intended to replace advice given to you by your health care provider. Make sure you discuss any questions you have with your health care provider. Document Released: 10/11/2007 Document Revised: 12/12/2015 Document Reviewed: 01/16/2015 Elsevier Interactive Patient Education  2018 Elsevier Inc.  

## 2016-12-26 ENCOUNTER — Encounter: Payer: Self-pay | Admitting: Family Medicine

## 2018-02-14 NOTE — Progress Notes (Deleted)
[facility]care at [facility] [address] [place], [place] [address] [phone]  Date:  02/18/2018   Name:  Leonard Feigel   DOB:  1970-08-09   MRN:  [id number]  PCP:  Pearline Cables, MD    Chief Complaint: No chief complaint on file.   History of Present Illness:  Sergio Cardenas is a 47 y.o. very pleasant male patient who presents with the following:  CPE today History of overweight Labs: a year ago Immun: flu  Last seen by myself a little over a year ago for CPE  Patient Active Problem List   Diagnosis Date Noted  . Visit for preventive health examination 11/28/2015  . Adult BMI > 30 11/28/2015    Past Medical History:  Diagnosis Date  . Environmental allergies   . Seasonal allergies     Past Surgical History:  Procedure Laterality Date  . APPENDECTOMY  1993  . DENTAL SURGERY    . WISDOM TOOTH EXTRACTION     Approx 15 yrs ago    Social History   Tobacco Use  . Smoking status: Light Tobacco Smoker    Types: Cigars  . Smokeless tobacco: Never Used  . Tobacco comment: Cigar Only, not regularly  Substance Use Topics  . Alcohol use: Yes    Alcohol/week: 4.0 standard drinks    Types: 4 Cans of beer per week  . Drug use: No    Family History  Problem Relation Age of Onset  . Ovarian cancer Mother 21       Deceased  . Diabetes Mother   . Hypertension Mother   . Melanoma Father        Living  . Hypertension Father   . Diabetes Father   . Arthritis/Rheumatoid Sister        #1  . Healthy Sister        #2  . Healthy Brother        x1  . Diabetes Paternal Grandmother   . Emphysema Paternal Grandfather   . Healthy Daughter        Age 39  . Healthy Son        Age 26    Allergies  Allergen Reactions  . Amoxicillin Other (See Comments)    Nosebleeds    Medication list has been reviewed and updated.  Current Outpatient Medications on File Prior to Visit  Medication Sig Dispense Refill  .  Ascorbic Acid (VITAMIN C) 100 MG tablet Take 100 mg by mouth daily.    . Aspirin-Acetaminophen-Caffeine (EXCEDRIN PO) Take by mouth daily as needed.    . cetirizine (ZYRTEC) 10 MG tablet Take 10 mg by mouth daily as needed for allergies.    . Multiple Vitamins-Minerals (MENS MULTIVITAMIN PLUS) TABS Take by mouth daily.     No current facility-administered medications on file prior to visit.     Review of Systems:  As per HPI- otherwise negative.   Physical Examination: There were no vitals filed for this visit. There were no vitals filed for this visit. There is no height or weight on file to calculate BMI. Ideal Body Weight:    GEN: WDWN, NAD, Non-toxic, A & O x 3 HEENT: Atraumatic, Normocephalic. Neck supple. No masses, No LAD. Ears and Nose: No external deformity. CV: RRR, No M/G/R. No JVD. No thrill. No extra heart sounds. PULM: CTA B, no wheezes, crackles, rhonchi. No retractions. No resp. distress. No accessory muscle use. ABD: S,  NT, ND, +BS. No rebound. No HSM. EXTR: No c/c/e NEURO Normal gait.  PSYCH: Normally interactive. Conversant. Not depressed or anxious appearing.  Calm demeanor.    Assessment and Plan: ***  Signed Lamar Blinks, MD

## 2018-02-18 ENCOUNTER — Encounter: Payer: Commercial Managed Care - PPO | Admitting: Family Medicine

## 2018-02-28 NOTE — Progress Notes (Addendum)
[facility] [address] [place], [place] [address] [phone]  Date:  03/03/2018   Name:  Sergio Cardenas   DOB:  05-19-1970   MRN:  [id number]  PCP:  Pearline Cables, MD    Chief Complaint: Annual Exam   History of Present Illness:  Sergio Cardenas is a 47 y.o. very pleasant male patient who presents with the following:  Here today for a physical Last seen here about a year ago- lab notes from that time  Your blood count is normal, and metabolic profile looks fine  Your total cholesterol number is ok; however, your HDl (good cholesterol) is quite low, which gives you a less favorable total to HDL ratio. Also, your A1c (average blood sugar over the previous 3 months) is in the pre-diabetes range. This means you are at greater risk of developing diabetes in the future.  Weight loss is your best defense against diabetes! As we touched on in clinic, please work on gradual weight loss, perhaps 1 lbs a week. Even 20 lbs can really help.  We can plan to recheck your A1c in about 6 months  Your cholesterol may well improve with diet and exercise- however it would also be reasonable to consider a cholesterol medication. What are your thoughts? If you like we can start a cholesterol med for you. If you prefer not to do this, I would recommend that you start on an omega 3 supplement to help boost your HDL  In any case, please come and see me in about 6 months so we can check on your progress and fasting labs. Let me know what you think about cholesterol medication- you can reply to me here   Labs:due.  He is fasting today Immun: we can do his flu shot today  tdap is UTD No CP or SOB noted  He has never been a regular smoker, he might have a very rare cigar  His family just moved to a new home- he has 2 kids, they are 3 and 24 yo.  6th and 8th grade this year  They are enjoying the new house  No family history of colon  cancer  Wt Readings from Last 3 Encounters:  03/03/18 (!) 305 lb 6 oz (138.5 kg)  12/25/16 (!) 310 lb 6.4 oz (140.8 kg)  11/28/15 299 lb 8 oz (135.9 kg)   He has lost about 5 lbs since our last visit  He is having some issues with his knees - had a knee scope in the past  He is taking some fish oil in an attempt to improve his cholesterol  He has lost a few lbs but is still much too heavy  Patient Active Problem List   Diagnosis Date Noted  . Visit for preventive health examination 11/28/2015  . Adult BMI > 30 11/28/2015    Past Medical History:  Diagnosis Date  . Environmental allergies   . Seasonal allergies     Past Surgical History:  Procedure Laterality Date  . APPENDECTOMY  1993  . DENTAL SURGERY    . WISDOM TOOTH EXTRACTION     Approx 15 yrs ago    Social History   Tobacco Use  . Smoking status: Light Tobacco Smoker    Types: Cigars  . Smokeless tobacco: Never Used  . Tobacco comment: Cigar Only, not regularly  Substance Use Topics  . Alcohol use: Yes    Alcohol/week: 4.0 standard  drinks    Types: 4 Cans of beer per week  . Drug use: No    Family History  Problem Relation Age of Onset  . Ovarian cancer Mother 72       Deceased  . Diabetes Mother   . Hypertension Mother   . Melanoma Father        Living  . Hypertension Father   . Diabetes Father   . Arthritis/Rheumatoid Sister        #1  . Healthy Sister        #2  . Healthy Brother        x1  . Diabetes Paternal Grandmother   . Emphysema Paternal Grandfather   . Healthy Daughter        Age 58  . Healthy Son        Age 62    Allergies  Allergen Reactions  . Amoxicillin Other (See Comments)    Nosebleeds    Medication list has been reviewed and updated.  Current Outpatient Medications on File Prior to Visit  Medication Sig Dispense Refill  . Aspirin-Acetaminophen-Caffeine (EXCEDRIN PO) Take by mouth daily as needed.    . Ascorbic Acid (VITAMIN C) 100 MG tablet Take 100 mg by mouth  daily.    . cetirizine (ZYRTEC) 10 MG tablet Take 10 mg by mouth daily as needed for allergies.    . Multiple Vitamins-Minerals (MENS MULTIVITAMIN PLUS) TABS Take by mouth daily.     No current facility-administered medications on file prior to visit.     Review of Systems:  As per HPI- otherwise negative. No CP or SOB Knee sx do not preclude desired activities    Physical Examination: Vitals:   03/03/18 1457  BP: 134/90  Pulse: 68  Resp: 16  Temp: 98.3 F (36.8 C)  SpO2: 96%   Vitals:   03/03/18 1457  Weight: (!) 305 lb 6 oz (138.5 kg)  Height: 5\' 10"  (1.778 m)   Body mass index is 43.82 kg/m. Ideal Body Weight: Weight in (lb) to have BMI = 25: 173.9  GEN: WDWN, NAD, Non-toxic, A & O x 3, large build, looks well  HEENT: Atraumatic, Normocephalic. Neck supple. No masses, No LAD.  Bilateral TM wnl, oropharynx normal.  PEERL,EOMI.   Ears and Nose: No external deformity. CV: RRR, No M/G/R. No JVD. No thrill. No extra heart sounds. PULM: CTA B, no wheezes, crackles, rhonchi. No retractions. No resp. distress. No accessory muscle use. ABD: S, NT, ND. No rebound. No HSM. EXTR: No c/c/e NEURO Normal gait.  PSYCH: Normally interactive. Conversant. Not depressed or anxious appearing.  Calm demeanor.   BP Readings from Last 3 Encounters:  03/03/18 134/90  12/25/16 121/86  11/28/15 128/88    Assessment and Plan: Physical exam  Screening for deficiency anemia - Plan: CBC  Screening for diabetes mellitus - Plan: Comprehensive metabolic panel, Hemoglobin A1c  Screening for hyperlipidemia - Plan: Lipid panel  Immunization due  Need for influenza vaccination - Plan: Flu Vaccine QUAD 6+ mos PF IM (Fluarix Quad PF)  CPE today  Labs pending Will complete forms for his job once labs come in Flu shot given Suspect his cholesterol and A1c will still be too high  His BP is borderline- asked him to monitor this at home, he does have a home cuff that he can use    Signed Abbe Amsterdam, MD   Received his labs 11/7 Results for orders placed or performed in visit on 03/03/18  CBC  Result Value Ref Range   WBC 6.1 4.0 - 10.5 K/uL   RBC 4.90 4.22 - 5.81 Mil/uL   Platelets 240.0 150.0 - 400.0 K/uL   Hemoglobin 15.5 13.0 - 17.0 g/dL   HCT 09.8 11.9 - 14.7 %   MCV 92.2 78.0 - 100.0 fl   MCHC 34.3 30.0 - 36.0 g/dL   RDW 82.9 56.2 - 13.0 %  Comprehensive metabolic panel  Result Value Ref Range   Sodium 137 135 - 145 mEq/L   Potassium 3.8 3.5 - 5.1 mEq/L   Chloride 101 96 - 112 mEq/L   CO2 27 19 - 32 mEq/L   Glucose, Bld 89 70 - 99 mg/dL   BUN 15 6 - 23 mg/dL   Creatinine, Ser 8.65 0.40 - 1.50 mg/dL   Total Bilirubin 0.7 0.2 - 1.2 mg/dL   Alkaline Phosphatase 69 39 - 117 U/L   AST 22 0 - 37 U/L   ALT 34 0 - 53 U/L   Total Protein 7.2 6.0 - 8.3 g/dL   Albumin 4.5 3.5 - 5.2 g/dL   Calcium 9.2 8.4 - 78.4 mg/dL   GFR 69.62 >95.28 mL/min  Hemoglobin A1c  Result Value Ref Range   Hgb A1c MFr Bld 6.0 4.6 - 6.5 %  Lipid panel  Result Value Ref Range   Cholesterol 207 (H) 0 - 200 mg/dL   Triglycerides 413.2 (H) 0.0 - 149.0 mg/dL   HDL 44.01 (L) >02.72 mg/dL   VLDL 53.6 (H) 0.0 - 64.4 mg/dL   Total CHOL/HDL Ratio 6    NonHDL 174.13   LDL cholesterol, direct  Result Value Ref Range   Direct LDL 145.0 mg/dL   Completed his form for work and also sent message to pt   The 10-year ASCVD risk score Denman George DC Jr., et al., 2013) is: 12.4%   Values used to calculate the score:     Age: 21 years     Sex: Male     Is Non-Hispanic African American: No     Diabetic: No     Tobacco smoker: Yes     Systolic Blood Pressure: 140 mmHg     Is BP treated: No     HDL Cholesterol: 32.7 mg/dL     Total Cholesterol: 207 mg/dL

## 2018-03-03 ENCOUNTER — Ambulatory Visit (INDEPENDENT_AMBULATORY_CARE_PROVIDER_SITE_OTHER): Payer: Commercial Managed Care - PPO | Admitting: Family Medicine

## 2018-03-03 ENCOUNTER — Encounter: Payer: Self-pay | Admitting: Family Medicine

## 2018-03-03 VITALS — BP 140/90 | HR 68 | Temp 98.3°F | Resp 16 | Ht 70.0 in | Wt 305.4 lb

## 2018-03-03 DIAGNOSIS — Z131 Encounter for screening for diabetes mellitus: Secondary | ICD-10-CM | POA: Diagnosis not present

## 2018-03-03 DIAGNOSIS — Z0000 Encounter for general adult medical examination without abnormal findings: Secondary | ICD-10-CM | POA: Diagnosis not present

## 2018-03-03 DIAGNOSIS — Z13220 Encounter for screening for lipoid disorders: Secondary | ICD-10-CM | POA: Diagnosis not present

## 2018-03-03 DIAGNOSIS — Z23 Encounter for immunization: Secondary | ICD-10-CM | POA: Diagnosis not present

## 2018-03-03 DIAGNOSIS — Z130 Encounter for screening for diseases of the blood and blood-forming organs and certain disorders involving the immune mechanism: Secondary | ICD-10-CM | POA: Diagnosis not present

## 2018-03-03 NOTE — Patient Instructions (Addendum)
Please keep an eye on your BP-perhaps check twice a week.   If you are running higher than 140/90 on a routine basis please alert me   I will be in touch with your labs asap  I hope that your daughter is well again very soon!   Health Maintenance, Male A healthy lifestyle and preventive care is important for your health and wellness. Ask your health care provider about what schedule of regular examinations is right for you. What should I know about weight and diet? Eat a Healthy Diet  Eat plenty of vegetables, fruits, whole grains, low-fat dairy products, and lean protein.  Do not eat a lot of foods high in solid fats, added sugars, or salt.  Maintain a Healthy Weight Regular exercise can help you achieve or maintain a healthy weight. You should:  Do at least 150 minutes of exercise each week. The exercise should increase your heart rate and make you sweat (moderate-intensity exercise).  Do strength-training exercises at least twice a week.  Watch Your Levels of Cholesterol and Blood Lipids  Have your blood tested for lipids and cholesterol every 5 years starting at 47 years of age. If you are at high risk for heart disease, you should start having your blood tested when you are 47 years old. You may need to have your cholesterol levels checked more often if: ? Your lipid or cholesterol levels are high. ? You are older than 47 years of age. ? You are at high risk for heart disease.  What should I know about cancer screening? Many types of cancers can be detected early and may often be prevented. Lung Cancer  You should be screened every year for lung cancer if: ? You are a current smoker who has smoked for at least 30 years. ? You are a former smoker who has quit within the past 15 years.  Talk to your health care provider about your screening options, when you should start screening, and how often you should be screened.  Colorectal Cancer  Routine colorectal cancer  screening usually begins at 47 years of age and should be repeated every 5-10 years until you are 47 years old. You may need to be screened more often if early forms of precancerous polyps or small growths are found. Your health care provider may recommend screening at an earlier age if you have risk factors for colon cancer.  Your health care provider may recommend using home test kits to check for hidden blood in the stool.  A small camera at the end of a tube can be used to examine your colon (sigmoidoscopy or colonoscopy). This checks for the earliest forms of colorectal cancer.  Prostate and Testicular Cancer  Depending on your age and overall health, your health care provider may do certain tests to screen for prostate and testicular cancer.  Talk to your health care provider about any symptoms or concerns you have about testicular or prostate cancer.  Skin Cancer  Check your skin from head to toe regularly.  Tell your health care provider about any new moles or changes in moles, especially if: ? There is a change in a mole's size, shape, or color. ? You have a mole that is larger than a pencil eraser.  Always use sunscreen. Apply sunscreen liberally and repeat throughout the day.  Protect yourself by wearing long sleeves, pants, a wide-brimmed hat, and sunglasses when outside.  What should I know about heart disease, diabetes, and high blood pressure?  If you are 56-47 years of age, have your blood pressure checked every 3-5 years. If you are 51 years of age or older, have your blood pressure checked every year. You should have your blood pressure measured twice-once when you are at a hospital or clinic, and once when you are not at a hospital or clinic. Record the average of the two measurements. To check your blood pressure when you are not at a hospital or clinic, you can use: ? An automated blood pressure machine at a pharmacy. ? A home blood pressure monitor.  Talk to your  health care provider about your target blood pressure.  If you are between 15-50 years old, ask your health care provider if you should take aspirin to prevent heart disease.  Have regular diabetes screenings by checking your fasting blood sugar level. ? If you are at a normal weight and have a low risk for diabetes, have this test once every three years after the age of 38. ? If you are overweight and have a high risk for diabetes, consider being tested at a younger age or more often.  A one-time screening for abdominal aortic aneurysm (AAA) by ultrasound is recommended for men aged 20-75 years who are current or former smokers. What should I know about preventing infection? Hepatitis B If you have a higher risk for hepatitis B, you should be screened for this virus. Talk with your health care provider to find out if you are at risk for hepatitis B infection. Hepatitis C Blood testing is recommended for:  Everyone born from [birth year] through 1965.  Anyone with known risk factors for hepatitis C.  Sexually Transmitted Diseases (STDs)  You should be screened each year for STDs including gonorrhea and chlamydia if: ? You are sexually active and are younger than 47 years of age. ? You are older than 47 years of age and your health care provider tells you that you are at risk for this type of infection. ? Your sexual activity has changed since you were last screened and you are at an increased risk for chlamydia or gonorrhea. Ask your health care provider if you are at risk.  Talk with your health care provider about whether you are at high risk of being infected with HIV. Your health care provider may recommend a prescription medicine to help prevent HIV infection.  What else can I do?  Schedule regular health, dental, and eye exams.  Stay current with your vaccines (immunizations).  Do not use any tobacco products, such as cigarettes, chewing tobacco, and e-cigarettes. If you need help  quitting, ask your health care provider.  Limit alcohol intake to no more than 2 drinks per day. One drink equals 12 ounces of beer, 5 ounces of wine, or 1 ounces of hard liquor.  Do not use street drugs.  Do not share needles.  Ask your health care provider for help if you need support or information about quitting drugs.  Tell your health care provider if you often feel depressed.  Tell your health care provider if you have ever been abused or do not feel safe at home. This information is not intended to replace advice given to you by your health care provider. Make sure you discuss any questions you have with your health care provider. Document Released: 10/11/2007 Document Revised: 12/12/2015 Document Reviewed: 01/16/2015 Elsevier Interactive Patient Education  [employer].

## 2018-03-04 ENCOUNTER — Encounter: Payer: Self-pay | Admitting: Family Medicine

## 2018-03-04 LAB — CBC
HEMATOCRIT: 45.2 % (ref 39.0–52.0)
HEMOGLOBIN: 15.5 g/dL (ref 13.0–17.0)
MCHC: 34.3 g/dL (ref 30.0–36.0)
MCV: 92.2 fl (ref 78.0–100.0)
PLATELETS: 240 10*3/uL (ref 150.0–400.0)
RBC: 4.9 Mil/uL (ref 4.22–5.81)
RDW: 13.1 % (ref 11.5–15.5)
WBC: 6.1 10*3/uL (ref 4.0–10.5)

## 2018-03-04 LAB — LDL CHOLESTEROL, DIRECT: Direct LDL: 145 mg/dL

## 2018-03-04 LAB — COMPREHENSIVE METABOLIC PANEL
ALK PHOS: 69 U/L (ref 39–117)
ALT: 34 U/L (ref 0–53)
AST: 22 U/L (ref 0–37)
Albumin: 4.5 g/dL (ref 3.5–5.2)
BILIRUBIN TOTAL: 0.7 mg/dL (ref 0.2–1.2)
BUN: 15 mg/dL (ref 6–23)
CO2: 27 mEq/L (ref 19–32)
Calcium: 9.2 mg/dL (ref 8.4–10.5)
Chloride: 101 mEq/L (ref 96–112)
Creatinine, Ser: 0.97 mg/dL (ref 0.40–1.50)
GFR: 88.11 mL/min (ref >60.00)
Glucose, Bld: 89 mg/dL (ref 70–99)
POTASSIUM: 3.8 meq/L (ref 3.5–5.1)
Sodium: 137 mEq/L (ref 135–145)
TOTAL PROTEIN: 7.2 g/dL (ref 6.0–8.3)

## 2018-03-04 LAB — LIPID PANEL
Cholesterol: 207 mg/dL — ABNORMAL HIGH (ref 0–200)
HDL: 32.7 mg/dL — AB (ref >39.00)
NonHDL: 174.13
TRIGLYCERIDES: 229 mg/dL — AB (ref 0.0–149.0)
Total CHOL/HDL Ratio: 6
VLDL: 45.8 mg/dL — ABNORMAL HIGH (ref 0.0–40.0)

## 2018-03-04 LAB — HEMOGLOBIN A1C: Hgb A1c MFr Bld: 6 % (ref 4.6–6.5)

## 2018-11-26 IMAGING — MR MR KNEE*L* W/O CM
4 of 5 series · 21 of 40 positions shown · non-contrast
Comparison: None.

CLINICAL DATA: Medial left knee pain for 1.5 weeks after a throwing
injury involving twisting of the knee.

EXAM:
MRI OF THE LEFT KNEE WITHOUT CONTRAST
TECHNIQUE: Multiplanar, multisequence MR imaging of the knee was performed. No
intravenous contrast was administered.

[Series 3: pd_tse_fs_tra · axial · 4.0mm · 0.42mm/px · z∈[-31,+44]mm · 3 of 25 slices shown]
[im 4/25]
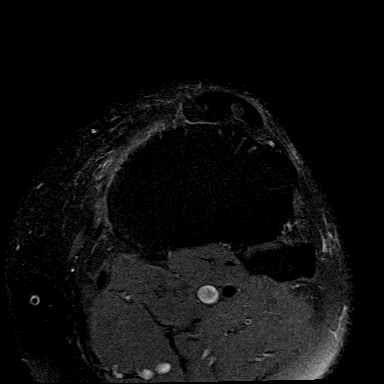
[im 14/25]
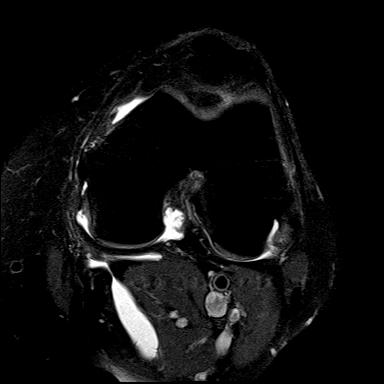
[im 21/25]
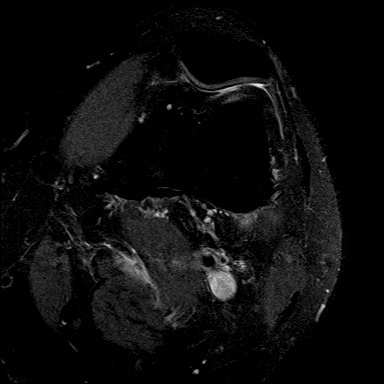

[Series 5: T2 fat-sat · coronal · 3.5mm · 0.62mm/px · 8 of 28 slices shown]
[im 1/28]
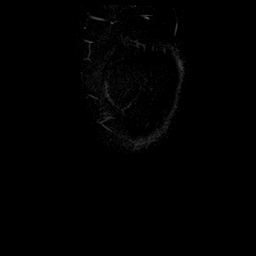
[im 4/28]
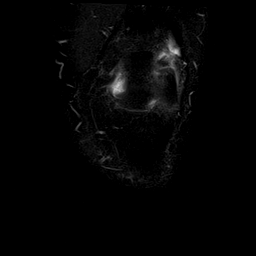
[im 8/28]
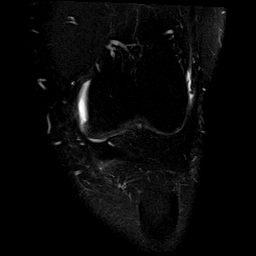
[im 12/28]
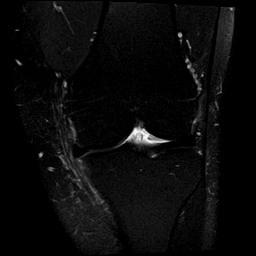
[im 16/28]
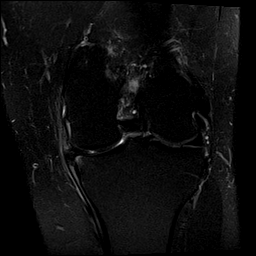
[im 20/28]
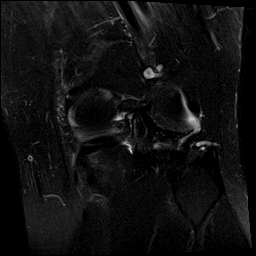
[im 24/28]
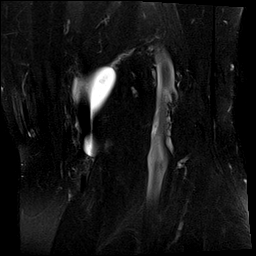
[im 28/28]
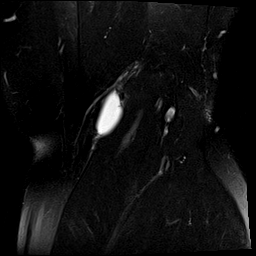

[Series 6: PD fat-sat · sagittal · 3.5mm · 0.27mm/px · 7 of 25 slices shown]
[im 1/25]
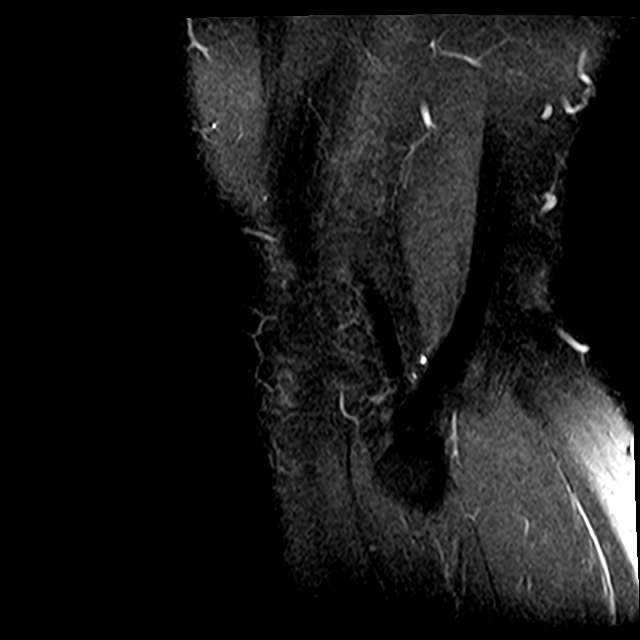
[im 5/25]
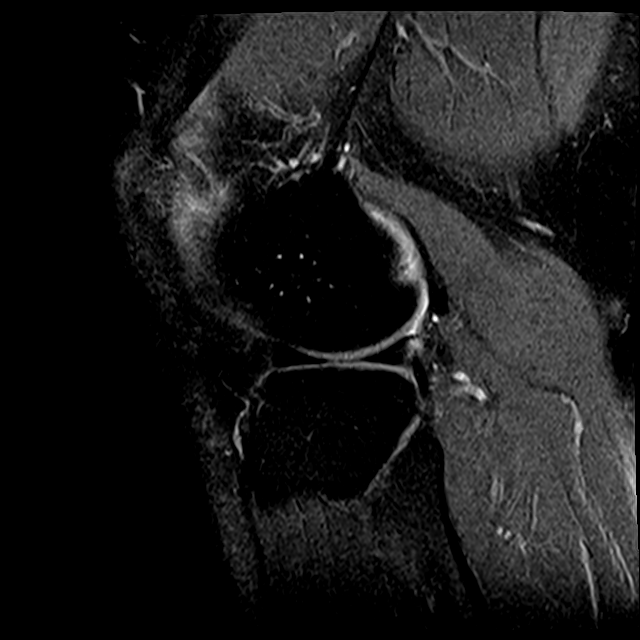
[im 9/25]
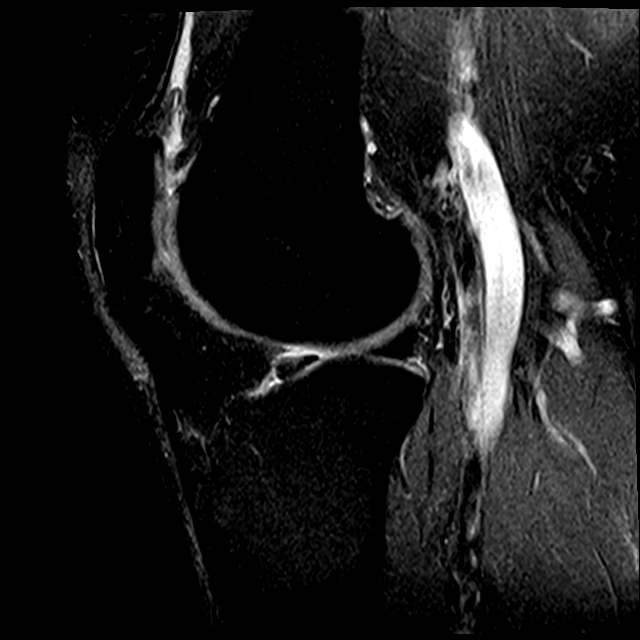
[im 13/25]
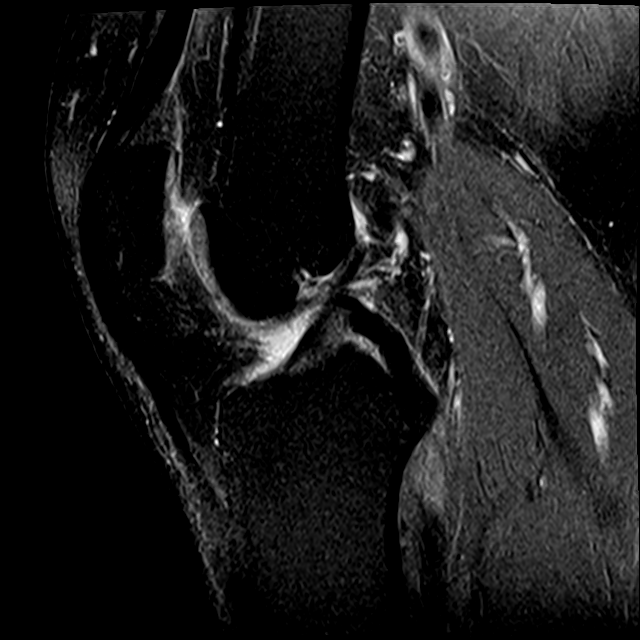
[im 17/25]
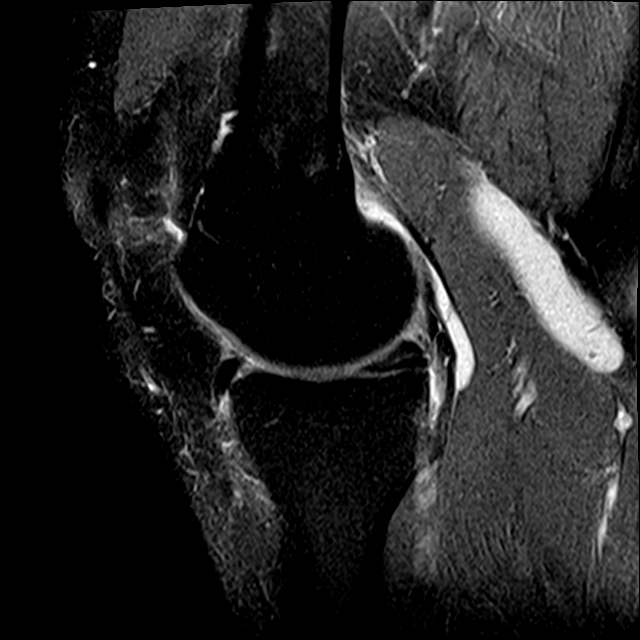
[im 21/25]
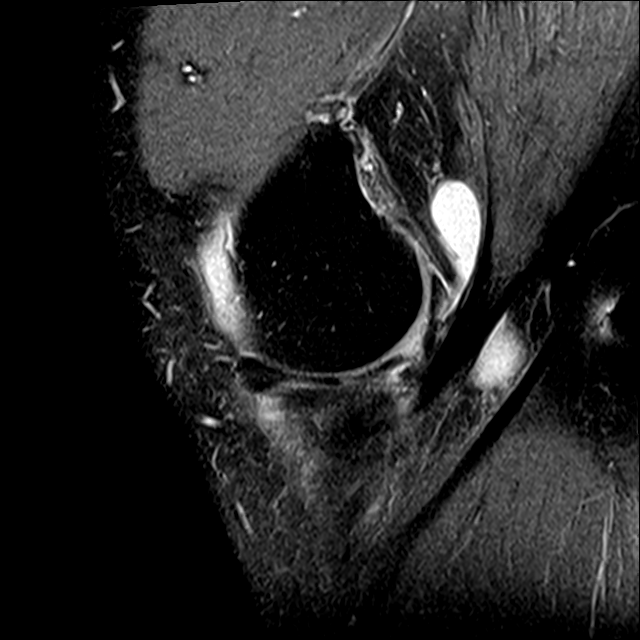
[im 25/25]
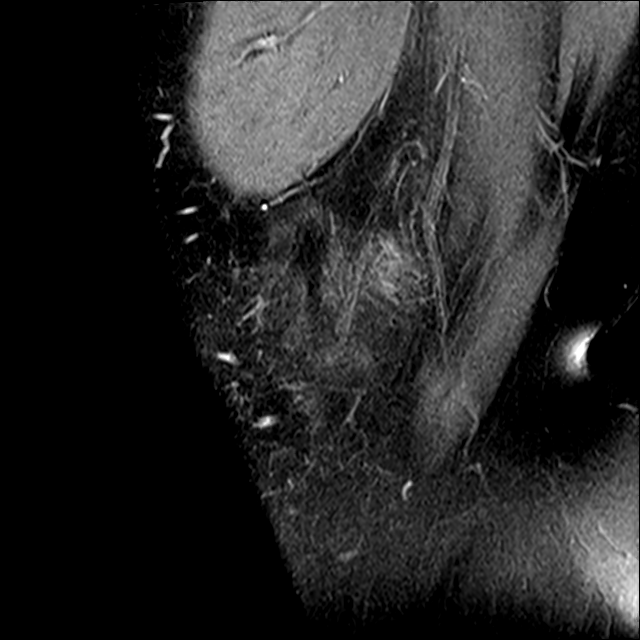

[Series 7: T1 · coronal · 3.5mm · 0.25mm/px · 3 of 28 slices shown]
[im 4/28]
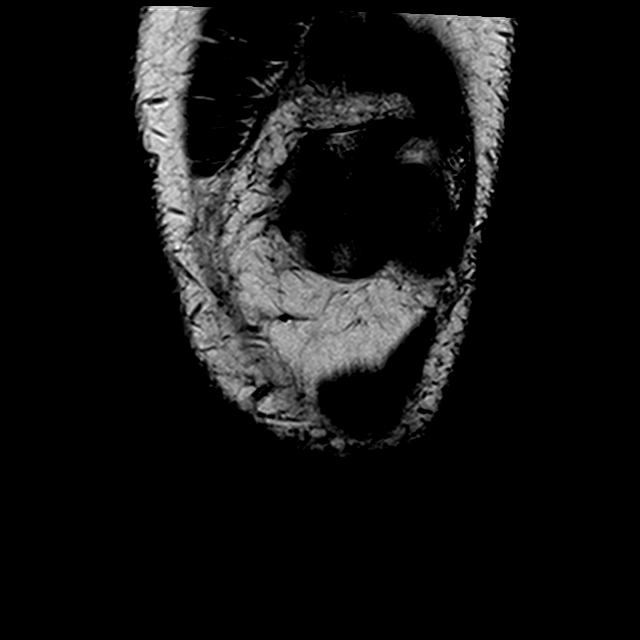
[im 16/28]
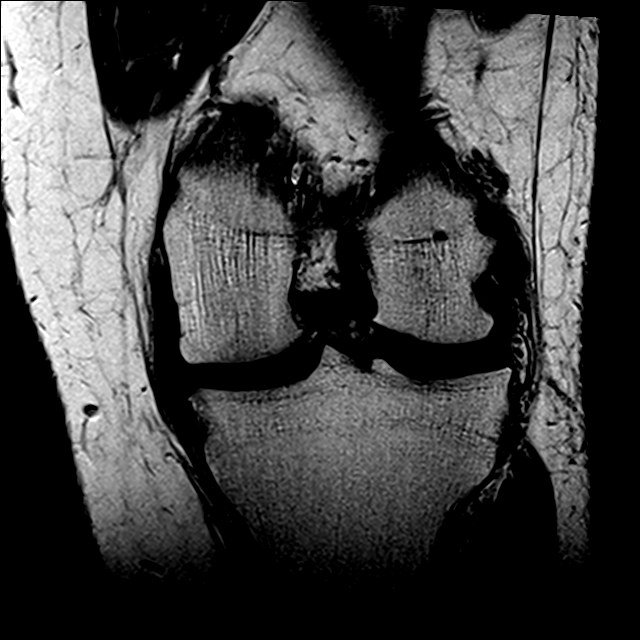
[im 24/28]
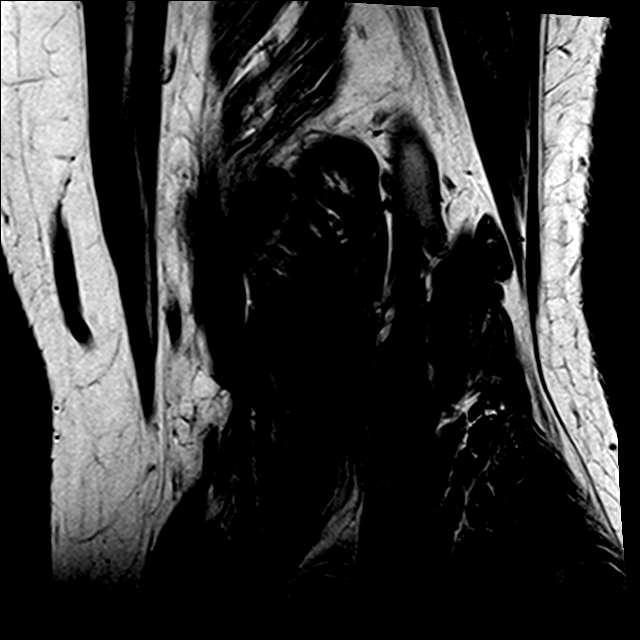

[21 of 40 positions shown; findings below may reference images not displayed]

FINDINGS: Despite efforts by the technologist and patient, motion artifact is
present on today's exam and could not be eliminated. This reduces
exam sensitivity and specificity.

MENISCI

Medial meniscus: Mildly complex tear posterior horn medial meniscus
with radial and oblique longitudinal components, images 3-8 of
series 6. The oblique component involves the inferior surface and
extends into the midbody of the meniscus.

Lateral meniscus:  Unremarkable

LIGAMENTS

Cruciates:  Unremarkable

Collaterals: Thickened proximal MCL with surrounding edema
suggesting grade 2 sprain.

CARTILAGE

Patellofemoral: Mild chondral heterogeneity along the posterior
patellar ridge. Mild chondral thinning medially in the femoral
trochlear groove.

Medial:  Moderate degenerative chondral thinning.

Lateral:  Unremarkable

Joint:  Trace knee effusion.

Popliteal Fossa:  Moderate size Baker's cyst.

Extensor Mechanism: Small ossicle in the distal patellar tendon
likely from remote Osgood-Schlatter disease, no current edema signal
to suggest active process. Otherwise unremarkable.

Bones: No significant extra-articular osseous abnormalities
identified.

Other: No supplemental non-categorized findings.
IMPRESSION: 1. Complex tear posterior horn medial meniscus with oblique and
radial components.
2. Grade 2 MCL sprain.
3. Trace knee effusion with moderate size Baker's cyst.
4. Mild chondral edema or chondromalacia along the posterior
patellar ridge. Moderate chondral thinning in the medial compartment
with mild chondral thinning along the medial part of the femoral
trochlear groove.

## 2022-06-30 ENCOUNTER — Encounter (HOSPITAL_BASED_OUTPATIENT_CLINIC_OR_DEPARTMENT_OTHER): Payer: Self-pay

## 2022-06-30 ENCOUNTER — Other Ambulatory Visit: Payer: Self-pay

## 2022-06-30 DIAGNOSIS — W010XXA Fall on same level from slipping, tripping and stumbling without subsequent striking against object, initial encounter: Secondary | ICD-10-CM | POA: Diagnosis not present

## 2022-06-30 DIAGNOSIS — S8011XA Contusion of right lower leg, initial encounter: Secondary | ICD-10-CM | POA: Diagnosis not present

## 2022-06-30 DIAGNOSIS — M79604 Pain in right leg: Secondary | ICD-10-CM | POA: Diagnosis present

## 2022-06-30 NOTE — ED Triage Notes (Signed)
I was standing on a retaining wall, my right foot slipped off causing my lower inner leg to scrape against the brick.   Pt ambulatory without difficulty. Does not take a blood thinner. Denies SOB.   Sent by [facility] for eval.

## 2022-07-01 ENCOUNTER — Emergency Department (HOSPITAL_BASED_OUTPATIENT_CLINIC_OR_DEPARTMENT_OTHER)
Admission: EM | Admit: 2022-07-01 | Discharge: 2022-07-01 | Disposition: A | Discharge status: Home / Self Care | Payer: Commercial Managed Care - PPO | Attending: Emergency Medicine | Admitting: Emergency Medicine

## 2022-07-01 DIAGNOSIS — M79604 Pain in right leg: Secondary | ICD-10-CM

## 2022-07-01 DIAGNOSIS — W19XXXA Unspecified fall, initial encounter: Secondary | ICD-10-CM

## 2022-07-01 NOTE — ED Provider Notes (Signed)
[facility] Provider Note   CSN: [id number] Arrival date & time: 06/30/22  2259     History  Chief Complaint  Patient presents with   Fall   Leg Pain    Sergio Cardenas is a 52 y.o. male.  52 year old male who presents ER today secondary to a fall couple days ago.  Patient was walking a retaining wall and his foot slipped and fell down scraping the medial portion of his right tibia causing abrasions and bruising.  Bruising got worse today so he called a telehealth service.  He sent in 1 picture of his leg and this that he needed to come to the ER to be evaluated for a blood clot.  Patient states the pains been improving since he has been using Ace wrap, gauze and Neosporin.  States the swelling is also improving.  No fevers.  Has been keeping it covered prior to my evaluation.  No other injuries.  Is able to ambulate without difficulty walks half a mile back-and-forth to his car at work is able to go up steps without difficulty.   Fall  Leg Pain      Home Medications Prior to Admission medications   Medication Sig Start Date End Date Taking? Authorizing Provider  Ascorbic Acid (VITAMIN C) 100 MG tablet Take 100 mg by mouth daily.    [provider]  Aspirin-Acetaminophen-Caffeine (EXCEDRIN PO) Take by mouth daily as needed.    [provider]  cetirizine (ZYRTEC) 10 MG tablet Take 10 mg by mouth daily as needed for allergies.    [provider]  Multiple Vitamins-Minerals (MENS MULTIVITAMIN PLUS) TABS Take by mouth daily.    [provider]      Allergies    Amoxicillin    Review of Systems   Review of Systems  Physical Exam Updated Vital Signs BP (!) 149/98 (BP Location: Left Arm)   Pulse (!) 102   Temp 97.7 F (36.5 C)   Resp 18   Ht '5\' 11"'$  (1.803 m)   Wt 131.5 kg   SpO2 98%   BMI 40.45 kg/m  Physical Exam Vitals and nursing note reviewed.  Constitutional:      Appearance: He is  well-developed.  HENT:     Head: Normocephalic and atraumatic.  Cardiovascular:     Rate and Rhythm: Normal rate.  Pulmonary:     Effort: Pulmonary effort is normal. No respiratory distress.  Abdominal:     General: There is no distension.  Musculoskeletal:        General: Normal range of motion.     Cervical back: Normal range of motion.  Skin:    General: Skin is warm and dry.     Comments: Multiple small abrasions to right medial leg with significant ecchymosis in his whole right medial compartment.  Pulses intact.  Able to wiggle toes normally.  Normal strength in dorsiflexion/plantarflexion.  Compartments are soft.  Neurological:     General: No focal deficit present.     Mental Status: He is alert.     ED Results / Procedures / Treatments   Labs (all labs ordered are listed, but only abnormal results are displayed) Labs Reviewed - No data to display  EKG None  Radiology No results found.  Procedures Procedures    Medications Ordered in ED Medications - No data to display  ED Course/ Medical Decision Making/ A&P  Medical Decision Making  Low suspicion for DVT, I think all of his symptoms are traumatic.  No evidence of compartment syndrome and all his symptoms are improving.  No evidence of superficial infection.  I suspect that the bruising showed up a little bit later secondary to a deeper hematoma and just now migrating to the surface.  No indication for imaging at this time.  Shared decision making will return here if any new or worsening symptoms otherwise is comfortable and stable for discharge.   Final Clinical Impression(s) / ED Diagnoses Final diagnoses:  Fall, initial encounter  Right leg pain    Rx / DC Orders ED Discharge Orders     None         Abiel Antrim, Corene Cornea, MD 07/01/22 [phone]

## 2024-01-11 ENCOUNTER — Encounter: Payer: Self-pay | Admitting: Gastroenterology

## 2024-02-17 ENCOUNTER — Ambulatory Visit

## 2024-02-17 VITALS — Ht 71.0 in | Wt 265.0 lb

## 2024-02-17 DIAGNOSIS — Z1211 Encounter for screening for malignant neoplasm of colon: Secondary | ICD-10-CM

## 2024-02-17 MED ORDER — NA SULFATE-K SULFATE-MG SULF 17.5-3.13-1.6 GM/177ML PO SOLN: 1.0000 | Freq: Once | ORAL | 0 refills | Status: AC

## 2024-02-17 NOTE — Progress Notes (Signed)

## 2024-03-02 ENCOUNTER — Encounter: Admitting: Gastroenterology

## 2024-03-21 ENCOUNTER — Encounter: Payer: Self-pay | Admitting: Gastroenterology

## 2024-03-31 ENCOUNTER — Encounter: Payer: Self-pay | Admitting: Gastroenterology

## 2024-03-31 ENCOUNTER — Ambulatory Visit: Admitting: Gastroenterology

## 2024-03-31 VITALS — BP 142/98 | HR 75 | Temp 98.1°F | Resp 16 | Ht 71.0 in | Wt 265.0 lb

## 2024-03-31 DIAGNOSIS — K640 First degree hemorrhoids: Secondary | ICD-10-CM | POA: Diagnosis not present

## 2024-03-31 DIAGNOSIS — D123 Benign neoplasm of transverse colon: Secondary | ICD-10-CM

## 2024-03-31 DIAGNOSIS — K621 Rectal polyp: Secondary | ICD-10-CM | POA: Diagnosis not present

## 2024-03-31 DIAGNOSIS — D175 Benign lipomatous neoplasm of intra-abdominal organs: Secondary | ICD-10-CM

## 2024-03-31 DIAGNOSIS — D128 Benign neoplasm of rectum: Secondary | ICD-10-CM

## 2024-03-31 DIAGNOSIS — K6389 Other specified diseases of intestine: Secondary | ICD-10-CM | POA: Diagnosis not present

## 2024-03-31 DIAGNOSIS — D120 Benign neoplasm of cecum: Secondary | ICD-10-CM

## 2024-03-31 DIAGNOSIS — K635 Polyp of colon: Secondary | ICD-10-CM | POA: Diagnosis not present

## 2024-03-31 DIAGNOSIS — Z1211 Encounter for screening for malignant neoplasm of colon: Secondary | ICD-10-CM | POA: Diagnosis present

## 2024-03-31 DIAGNOSIS — D122 Benign neoplasm of ascending colon: Secondary | ICD-10-CM

## 2024-03-31 MED ORDER — SODIUM CHLORIDE 0.9 % IV SOLN: 500.0000 mL | INTRAVENOUS | Status: DC

## 2024-03-31 NOTE — Progress Notes (Signed)
 [facility] History and Physical   Primary Care Physician:  Pcp, No   Reason for Procedure:     CRC screening  Plan:     colonoscopy   The patient was provided an opportunity to ask questions and all were answered. The patient agreed with the plan.   HPI: Sergio Cardenas is a 53 y.o. male    Past Medical History:  Diagnosis Date   Allergy    Cancer (HCC)    nose; basal cell   Environmental allergies    Seasonal allergies     Past Surgical History:  Procedure Laterality Date   APPENDECTOMY  1993   BASAL CELL CARCINOMA EXCISION     DENTAL SURGERY     KNEE ARTHROSCOPY Left    partial menis.   WISDOM TOOTH EXTRACTION     Approx 15 yrs ago    Prior to Admission medications   Medication Sig Start Date End Date Taking? Authorizing Provider  Ascorbic Acid (VITAMIN C) 100 MG tablet Take 100 mg by mouth daily. Patient taking differently: Take 100 mg by mouth daily as needed.   Yes [provider]  Aspirin-Acetaminophen-Caffeine (EXCEDRIN PO) Take by mouth daily as needed.   Yes [provider]  cetirizine (ZYRTEC) 10 MG tablet Take 10 mg by mouth daily as needed for allergies.   Yes [provider]  Multiple Vitamins-Minerals (MENS MULTIVITAMIN PLUS) TABS Take by mouth daily.   Yes [provider]    Current Outpatient Medications  Medication Sig Dispense Refill   Ascorbic Acid (VITAMIN C) 100 MG tablet Take 100 mg by mouth daily. (Patient taking differently: Take 100 mg by mouth daily as needed.)     Aspirin-Acetaminophen-Caffeine (EXCEDRIN PO) Take by mouth daily as needed.     cetirizine (ZYRTEC) 10 MG tablet Take 10 mg by mouth daily as needed for allergies.     Multiple Vitamins-Minerals (MENS MULTIVITAMIN PLUS) TABS Take by mouth daily.     No current facility-administered medications for this visit.    Allergies as of 03/31/2024 - Review Complete 03/31/2024  Allergen Reaction Noted   Amoxicillin Other (See Comments)  11/28/2015    Family History  Problem Relation Age of Onset   Ovarian cancer Mother 53       Deceased   Diabetes Mother    Hypertension Mother    Uterine cancer Mother    Melanoma Father        Living   Hypertension Father    Diabetes Father    Arthritis/Rheumatoid Sister        #1   Healthy Brother        x1   Diabetes Paternal Grandmother    Emphysema Paternal Grandfather    Healthy Daughter        Age 53   Healthy Son        Age 19   Colon cancer Neg Hx    Esophageal cancer Neg Hx    Rectal cancer Neg Hx    Stomach cancer Neg Hx    Colon polyps Neg Hx     Social History   Socioeconomic History   Marital status: Married    Spouse name: Not on file   Number of children: Not on file   Years of education: Not on file   Highest education level: Not on file  Occupational History   Not on file  Tobacco Use   Smoking status: Former    Types: Cigars   Smokeless tobacco: Never  Tobacco comments:    Cigar Only, not in a while  Vaping Use   Vaping status: Never Used  Substance and Sexual Activity   Alcohol use: Yes    Alcohol/week: 4.0 standard drinks of alcohol    Types: 4 Cans of beer per week    Comment: maybe a beer a day or less   Drug use: No   Sexual activity: Yes    Partners: Female    Comment: wife  Other Topics Concern   Not on file  Social History Narrative   Born in [place] . Moved to the area 21 years.    Is married -- Sergio Cardenas And Sergio Cardenas -- 11 and 9 respectively.       Social Drivers of Corporate Investment Banker Strain: Not on file  Food Insecurity: Not on file  Transportation Needs: Not on file  Physical Activity: Not on file  Stress: Not on file  Social Connections: Not on file  Intimate Partner Violence: Not on file    Review of Systems: Positive for none All other review of systems negative except as mentioned in the HPI.  Physical Exam: Vital signs in last 24 hours: @VSRANGES @   General:   Alert,   Well-developed, well-nourished, pleasant and cooperative in NAD Lungs:  Clear throughout to auscultation.   Heart:  Regular rate and rhythm; no murmurs, clicks, rubs,  or gallops. Abdomen:  Soft, nontender and nondistended. Normal bowel sounds.   Neuro/Psych:  Alert and cooperative. Normal mood and affect. A and O x 3    No significant changes were identified.  The patient continues to be an appropriate candidate for the planned procedure and anesthesia.   Anselm Bring, MD. [facility] 03/31/2024 4:32 PM@

## 2024-03-31 NOTE — Patient Instructions (Signed)
 YOU HAD AN ENDOSCOPIC PROCEDURE TODAY AT THE [facility]:   Refer to the procedure report that was given to you for any specific questions about what was found during the examination.  If the procedure report does not answer your questions, please call your gastroenterologist to clarify.  If you requested that your care partner not be given the details of your procedure findings, then the procedure report has been included in a sealed envelope for you to review at your convenience later.  YOU SHOULD EXPECT: Some feelings of bloating in the abdomen. Passage of more gas than usual.  Walking can help get rid of the air that was put into your GI tract during the procedure and reduce the bloating. If you had a lower endoscopy (such as a colonoscopy or flexible sigmoidoscopy) you may notice spotting of blood in your stool or on the toilet paper. If you underwent a bowel prep for your procedure, you may not have a normal bowel movement for a few days.  Please Note:  You might notice some irritation and congestion in your nose or some drainage.  This is from the oxygen used during your procedure.  There is no need for concern and it should clear up in a day or so.  SYMPTOMS TO REPORT IMMEDIATELY:  Following lower endoscopy (colonoscopy or flexible sigmoidoscopy):  Excessive amounts of blood in the stool  Significant tenderness or worsening of abdominal pains  Swelling of the abdomen that is new, acute  Fever of 100F or higher  Resume previous diet Continue present medications Await pathology results Handouts on hemorrhoids and polyps given   For urgent or emergent issues, a gastroenterologist can be reached at any hour by calling [phone]. Do not use MyChart messaging for urgent concerns.    DIET:  We do recommend a small meal at first, but then you may proceed to your regular diet.  Drink plenty of fluids but you should avoid alcoholic beverages for 24 hours.  ACTIVITY:  You  should plan to take it easy for the rest of today and you should NOT DRIVE or use heavy machinery until tomorrow (because of the sedation medicines used during the test).    FOLLOW UP: Our staff will call the number listed on your records the next business day following your procedure.  We will call around 7:15- 8:00 am to check on you and address any questions or concerns that you may have regarding the information given to you following your procedure. If we do not reach you, we will leave a message.     If any biopsies were taken you will be contacted by phone or by letter within the next 1-3 weeks.  Please call us  at [phone] if you have not heard about the biopsies in 3 weeks.    SIGNATURES/CONFIDENTIALITY: You and/or your care partner have signed paperwork which will be entered into your electronic medical record.  These signatures attest to the fact that that the information above on your After Visit Summary has been reviewed and is understood.  Full responsibility of the confidentiality of this discharge information lies with you and/or your care-partner.

## 2024-03-31 NOTE — Progress Notes (Signed)
 Called to room to assist during endoscopic procedure.  Patient ID and intended procedure confirmed with present staff. Received instructions for my participation in the procedure from the performing physician.

## 2024-03-31 NOTE — Progress Notes (Signed)
 Report given to PACU, vss

## 2024-03-31 NOTE — Op Note (Signed)
 [facility] Patient Name: Sergio Cardenas Procedure Date: 03/31/2024 9:11 AM MRN: [id number] Endoscopist: Lynnie Bring , MD, [id number] Age: 53 Referring MD:  Date of Birth: 30-Jun-1970 Gender: Male Account #: [id number] Procedure:                Colonoscopy Indications:              Screening for colorectal malignant neoplasm Medicines:                Monitored Anesthesia Care Procedure:                Pre-Anesthesia Assessment:                           - Prior to the procedure, a History and Physical                            was performed, and patient medications and                            allergies were reviewed. The patient's tolerance of                            previous anesthesia was also reviewed. The risks                            and benefits of the procedure and the sedation                            options and risks were discussed with the patient.                            All questions were answered, and informed consent                            was obtained. Prior Anticoagulants: The patient has                            taken no anticoagulant or antiplatelet agents. ASA                            Grade Assessment: II - A patient with mild systemic                            disease. After reviewing the risks and benefits,                            the patient was deemed in satisfactory condition to                            undergo the procedure.                           After obtaining informed consent, the colonoscope  was passed under direct vision. Throughout the                            procedure, the patient's blood pressure, pulse, and                            oxygen saturations were monitored continuously. The                            Olympus CF-HQ190L ([id number]) Colonoscope was                            introduced through the anus and advanced to the the                            cecum, identified by  appendiceal orifice and                            ileocecal valve. The colonoscopy was performed                            without difficulty. The patient tolerated the                            procedure well. The quality of the bowel                            preparation was good. The ileocecal valve,                            appendiceal orifice, and rectum were photographed. Scope In: 9:34:58 AM Scope Out: 9:50:07 AM Scope Withdrawal Time: 0 hours 12 minutes 26 seconds  Total Procedure Duration: 0 hours 15 minutes 9 seconds  Findings:                 Four sessile polyps were found in the proximal                            transverse colon, proximal ascending colon, mid                            ascending colon and cecum. The polyps were 2 to 3                            mm in size. These polyps were removed with a cold                            biopsy forceps. Resection and retrieval were                            complete.                           A 6 mm polyp was found in the rectum. The polyp  was                            sessile. The polyp was removed with a cold snare.                            Resection and retrieval were complete. Incidental                            note was made of a 1.5 cm lipoma in the descending                            colon.                           Non-bleeding internal hemorrhoids were found during                            retroflexion. The hemorrhoids were small and Grade                            I (internal hemorrhoids that do not prolapse).                           Retroflexion in the right colon was performed.                           The exam was otherwise without abnormality on                            direct and retroflexion views. Complications:            No immediate complications. Estimated Blood Loss:     Estimated blood loss: none. Impression:               - Four 2 to 3 mm polyps in the proximal transverse                             colon, in the proximal ascending colon, in the mid                            ascending colon and in the cecum, removed with a                            cold biopsy forceps. Resected and retrieved.                           - One 6 mm polyp in the rectum, removed with a cold                            snare. Resected and retrieved.                           - Non-bleeding internal hemorrhoids.                           -  The examination was otherwise normal on direct                            and retroflexion views. Recommendation:           - Patient has a contact number available for                            emergencies. The signs and symptoms of potential                            delayed complications were discussed with the                            patient. Return to normal activities tomorrow.                            Written discharge instructions were provided to the                            patient.                           - Resume previous diet.                           - Continue present medications.                           - Await pathology results.                           - Repeat colonoscopy for surveillance based on                            pathology results.                           - The findings and recommendations were discussed                            with the patient's family. Lynnie Bring, MD 03/31/2024 9:56:34 AM This report has been signed electronically.

## 2024-03-31 NOTE — Progress Notes (Signed)
 Pt's states no medical or surgical changes since previsit or office visit.

## 2024-04-01 ENCOUNTER — Telehealth: Payer: Self-pay

## 2024-04-01 NOTE — Telephone Encounter (Signed)
 No answer on follow up call - voice mail message left

## 2024-04-04 LAB — SURGICAL PATHOLOGY

## 2024-04-09 ENCOUNTER — Ambulatory Visit: Payer: Self-pay | Admitting: Gastroenterology
